# Patient Record
Sex: Female | Born: 1995 | Hispanic: No | Marital: Single | Smoking: Former smoker
Health system: Southern US, Community
[De-identification: ages and names within clinical notes are randomized; demographics above are authoritative.]

## PROBLEM LIST (undated history)

## (undated) DIAGNOSIS — Z8489 Family history of other specified conditions: Secondary | ICD-10-CM

## (undated) DIAGNOSIS — N390 Urinary tract infection, site not specified: Secondary | ICD-10-CM

## (undated) DIAGNOSIS — R42 Dizziness and giddiness: Secondary | ICD-10-CM

## (undated) DIAGNOSIS — B379 Candidiasis, unspecified: Secondary | ICD-10-CM

## (undated) DIAGNOSIS — U071 COVID-19: Secondary | ICD-10-CM

## (undated) HISTORY — DX: Family history of other specified conditions: Z84.89

## (undated) HISTORY — PX: OTHER SURGICAL HISTORY: SHX169

## (undated) HISTORY — PX: TONSILLECTOMY: SHX5217

## (undated) HISTORY — DX: COVID-19: U07.1

## (undated) HISTORY — PX: TONSILLECTOMY: SUR1361

---
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
## 2013-12-31 ENCOUNTER — Encounter (HOSPITAL_COMMUNITY): Payer: Self-pay | Admitting: Emergency Medicine

## 2013-12-31 ENCOUNTER — Emergency Department (HOSPITAL_COMMUNITY)
Admission: EM | Admit: 2013-12-31 | Discharge: 2013-12-31 | Disposition: A | Discharge status: Home / Self Care | Payer: 59 | Attending: Emergency Medicine | Admitting: Emergency Medicine

## 2013-12-31 DIAGNOSIS — Z87440 Personal history of urinary (tract) infections: Secondary | ICD-10-CM | POA: Insufficient documentation

## 2013-12-31 DIAGNOSIS — N898 Other specified noninflammatory disorders of vagina: Secondary | ICD-10-CM | POA: Insufficient documentation

## 2013-12-31 DIAGNOSIS — Z8619 Personal history of other infectious and parasitic diseases: Secondary | ICD-10-CM | POA: Insufficient documentation

## 2013-12-31 DIAGNOSIS — Z91040 Latex allergy status: Secondary | ICD-10-CM | POA: Insufficient documentation

## 2013-12-31 DIAGNOSIS — Z3202 Encounter for pregnancy test, result negative: Secondary | ICD-10-CM | POA: Insufficient documentation

## 2013-12-31 LAB — URINALYSIS, ROUTINE W REFLEX MICROSCOPIC
BILIRUBIN URINE: NEGATIVE
Glucose, UA: NEGATIVE mg/dL
KETONES UR: NEGATIVE mg/dL
Leukocytes, UA: NEGATIVE
NITRITE: NEGATIVE
PH: 6 (ref 5.0–8.0)
Protein, ur: NEGATIVE mg/dL
SPECIFIC GRAVITY, URINE: 1.027 (ref 1.005–1.030)
Urobilinogen, UA: 1 mg/dL (ref 0.0–1.0)

## 2013-12-31 LAB — URINE MICROSCOPIC-ADD ON

## 2013-12-31 LAB — WET PREP, GENITAL
Trich, Wet Prep: NONE SEEN
Yeast Wet Prep HPF POC: NONE SEEN

## 2013-12-31 LAB — PREGNANCY, URINE: PREG TEST UR: NEGATIVE

## 2013-12-31 LAB — HIV ANTIBODY (ROUTINE TESTING W REFLEX): HIV: NONREACTIVE

## 2013-12-31 NOTE — ED Provider Notes (Signed)
CSN: [id number]     Arrival date & time 12/31/13  0206 History   First MD Initiated Contact with Patient 12/31/13 0303     Chief Complaint  Patient presents with  . Vaginal Discharge     (Consider location/radiation/quality/duration/timing/severity/associated sxs/prior Treatment) HPI Comments: 18 y.o. female complains of clear and white vaginal discharge for 1-2 weeks. Denies abnormal vaginal bleeding or significant pelvic pain or fever. No UTI symptoms. Denies history of known exposure to STD in the last 2 years, but she does have herpes hx. Currently having intercourse with 1 partner and in a monogamous relationship.  Patient's last menstrual period was 12/06/2013. Pt was just given flagyl for BV, and pt's sx startedafter the antibiotic use.  Patient is a [age] y.o. female presenting with vaginal discharge. The history is provided by the patient.  Vaginal Discharge Associated symptoms: no abdominal pain, no dysuria, no fever, no nausea and no vomiting     Past Medical History  Diagnosis Date  . UTI (lower urinary tract infection)   . Yeast infection    Past Surgical History  Procedure Laterality Date  . Tonsillectomy    . Tonsillectomy    . Wisdonm teeth removed     No family history on file. History  Substance Use Topics  . Smoking status: Never Smoker   . Smokeless tobacco: Not on file  . Alcohol Use: No   OB History   Grav Para Term Preterm Abortions TAB SAB Ect Mult Living                 Review of Systems  Constitutional: Negative for fever.  Gastrointestinal: Negative for nausea, vomiting and abdominal pain.  Genitourinary: Positive for vaginal discharge. Negative for dysuria, flank pain, vaginal bleeding and vaginal pain.      Allergies  Fish allergy and Latex  Home Medications   Prior to Admission medications   Not on File   BP 114/71  Pulse 72  Temp(Src) 98.1 F (36.7 C) (Oral)  Resp 18  Wt 289 lb 7 oz (131.288 kg)  SpO2 100%  LMP  12/06/2013 Physical Exam  Nursing note and vitals reviewed. Constitutional: She is oriented to person, place, and time. She appears well-developed.  HENT:  Head: Normocephalic and atraumatic.  Eyes: Conjunctivae and EOM are normal. Pupils are equal, round, and reactive to light.  Neck: Normal range of motion. Neck supple.  Cardiovascular: Normal rate, regular rhythm, normal heart sounds and intact distal pulses.   No murmur heard. Pulmonary/Chest: Effort normal. No respiratory distress. She has no wheezes.  Abdominal: Soft. Bowel sounds are normal. She exhibits no distension. There is no tenderness. There is no rebound and no guarding.  Genitourinary: Vagina normal and uterus normal.  External exam - normal, no lesions Speculum exam: Pt has some white discharge, no blood Bimanual exam: Patient has no CMT, no adnexal tenderness or fullness and cervical os is closed  Neurological: She is alert and oriented to person, place, and time.  Skin: Skin is warm and dry.    ED Course  Procedures (including critical care time) Labs Review Labs Reviewed  WET PREP, GENITAL - Abnormal; Notable for the following:    Clue Cells Wet Prep HPF POC FEW (*)    WBC, Wet Prep HPF POC FEW (*)    All other components within normal limits  URINALYSIS, ROUTINE W REFLEX MICROSCOPIC - Abnormal; Notable for the following:    APPearance CLOUDY (*)    Hgb urine dipstick TRACE (*)  All other components within normal limits  URINE MICROSCOPIC-ADD ON - Abnormal; Notable for the following:    Squamous Epithelial / LPF FEW (*)    Bacteria, UA FEW (*)    All other components within normal limits  GC/CHLAMYDIA PROBE AMP  PREGNANCY, URINE  HIV ANTIBODY (ROUTINE TESTING)    Imaging Review No results found.   EKG Interpretation None      MDM   Final diagnoses:  Vaginal discharge    Pt here for Vaginal discharge. Pelvic exam is normal. The lab results show clue cell only. Advised Gyne f/u if  needed.   Derwood KaplanAnkit Electa Sterry, MD 12/31/13 [phone]

## 2013-12-31 NOTE — ED Notes (Signed)
Pt reports she was treated a couple weeks ago for bacterial vaginitis, completed her medications. Since then, she has been experiencing vaginal discharge, sometimes white and sometimes clear.  Denies odor, pain, itching.

## 2013-12-31 NOTE — ED Notes (Signed)
The pt has had a vaginal discharge for 1-2 weeks.  She was treated for a pelvic infection 2 weeks ago and was on antibiotics and she thinks the drainage may be from the antibiotics.  The discharge has no odor.  lmp June 10

## 2013-12-31 NOTE — Discharge Instructions (Signed)
We saw you in the ER for the vaginal discharge. All the results in the ER are normal. We are not sure what is causing your symptoms. The workup in the ER is not complete, and is limited to screening for life threatening and emergent conditions only, so please see a primary care doctor for further evaluation.

## 2014-01-01 LAB — GC/CHLAMYDIA PROBE AMP
CT Probe RNA: NEGATIVE
GC PROBE AMP APTIMA: NEGATIVE

## 2015-04-04 ENCOUNTER — Encounter (HOSPITAL_COMMUNITY): Payer: Self-pay | Admitting: Emergency Medicine

## 2015-04-04 ENCOUNTER — Emergency Department (HOSPITAL_COMMUNITY)
Admission: EM | Admit: 2015-04-04 | Discharge: 2015-04-04 | Disposition: A | Discharge status: Home / Self Care | Payer: Medicaid Other | Attending: Emergency Medicine | Admitting: Emergency Medicine

## 2015-04-04 DIAGNOSIS — Z91040 Latex allergy status: Secondary | ICD-10-CM | POA: Insufficient documentation

## 2015-04-04 DIAGNOSIS — H6091 Unspecified otitis externa, right ear: Secondary | ICD-10-CM | POA: Diagnosis not present

## 2015-04-04 DIAGNOSIS — Z87440 Personal history of urinary (tract) infections: Secondary | ICD-10-CM | POA: Diagnosis not present

## 2015-04-04 DIAGNOSIS — Z8619 Personal history of other infectious and parasitic diseases: Secondary | ICD-10-CM | POA: Diagnosis not present

## 2015-04-04 DIAGNOSIS — H9201 Otalgia, right ear: Secondary | ICD-10-CM | POA: Diagnosis present

## 2015-04-04 MED ORDER — NEOMYCIN-POLYMYXIN-HC 3.5-10000-1 OT SUSP: 3.0000 [drp] | Freq: Three times a day (TID) | OTIC | Status: DC

## 2015-04-04 NOTE — Discharge Instructions (Signed)
Apply eardrops 2-3 times a day. Apply a cotton wick. Would also recommend over-the-counter anti-histamine such as Claritin

## 2015-04-04 NOTE — ED Notes (Signed)
Pt sts right sided ear pain that feels like she got water in ear and wont come out

## 2015-04-04 NOTE — ED Provider Notes (Signed)
CSN: [id number]     Arrival date & time 04/04/15  1230 History  By signing my name below, I, Jarvis Morgan, attest that this documentation has been prepared under the direction and in the presence of Donnetta Hutching, MD. Electronically Signed: Jarvis Morgan, ED Scribe. 04/04/2015. 2:24 PM.    Chief Complaint  Patient presents with  . Otalgia    The history is provided by the patient. No language interpreter was used.    HPI Comments: Nichole Carlson is a 19 y.o. female who presents to the Emergency Department complaining of sudden onset, constant, mild, right sided otalgia onset 2 days. She states the pain began after she got out of the shower. She reports that she has a sensation of water stuck in her right ear. Pt has not taken anything for her symptoms. She denies any aggravating factors. Pt denies any fever or URI symptoms.   Past Medical History  Diagnosis Date  . UTI (lower urinary tract infection)   . Yeast infection    Past Surgical History  Procedure Laterality Date  . Tonsillectomy    . Tonsillectomy    . Wisdonm teeth removed     History reviewed. No pertinent family history. Social History  Substance Use Topics  . Smoking status: Never Smoker   . Smokeless tobacco: None  . Alcohol Use: No   OB History    No data available     Review of Systems 10 Systems reviewed and all are negative for acute change except as noted in the HPI.     Allergies  Fish allergy and Latex  Home Medications   Prior to Admission medications   Medication Sig Start Date End Date Taking? Authorizing Provider  neomycin-polymyxin-hydrocortisone (CORTISPORIN) 3.5-10000-1 otic suspension Place 3 drops into the right ear 3 (three) times daily. 04/04/15   Donnetta Hutching, MD   Triage Vitals: BP 154/98 mmHg  Pulse 90  Temp(Src) 98.1 F (36.7 C) (Oral)  Resp 20  Ht  (1.727 m)  Wt 330 lb (149.687 kg)  BMI 50.19 kg/m2  SpO2 99%  Physical Exam  Constitutional: She is oriented to person,  place, and time. She appears well-developed and well-nourished.  HENT:  Head: Normocephalic and atraumatic.  Right Ear: Tympanic membrane normal.  Left Ear: Tympanic membrane normal.  Mouth/Throat: Oropharynx is clear and moist.  Eyes: Conjunctivae and EOM are normal. Pupils are equal, round, and reactive to light.  Neck: Normal range of motion. Neck supple.  Musculoskeletal: Normal range of motion.  Neurological: She is alert and oriented to person, place, and time.  Skin: Skin is warm and dry.  Psychiatric: She has a normal mood and affect. Her behavior is normal.  Nursing note and vitals reviewed.   ED Course  Procedures (including critical care time)  DIAGNOSTIC STUDIES: Oxygen Saturation is 99% on RA, normal by my interpretation.    COORDINATION OF CARE: 2:09 PM- Will prescribe cortisporin otic drops. Pt advised of plan for treatment and pt agrees.     Labs Review Labs Reviewed - No data to display  Imaging Review No results found. I have personally reviewed and evaluated these images and lab results as part of my medical decision-making.   EKG Interpretation None      MDM   Final diagnoses:  Right otitis externa   Tympanic membrane was clear. No obvious inflammation in the external canal.  Rx Cortisporin Otic.  I, Kinze Labo, personally performed the services described in this documentation. All medical record entries  made by the scribe were at my direction and in my presence.  I have reviewed the chart and discharge instructions and agree that the record reflects my personal performance and is accurate and complete. Norabelle Kondo.  04/04/2015. 2:24 PM.      Donnetta Hutching, MD 04/04/15 1426

## 2015-04-04 NOTE — ED Notes (Signed)
Declined W/C at D/C and was escorted to lobby by RN. 

## 2015-05-02 ENCOUNTER — Encounter (HOSPITAL_COMMUNITY): Payer: Self-pay | Admitting: Emergency Medicine

## 2015-05-02 ENCOUNTER — Emergency Department (HOSPITAL_COMMUNITY)
Admission: EM | Admit: 2015-05-02 | Discharge: 2015-05-02 | Disposition: A | Discharge status: Home / Self Care | Payer: Medicaid Other | Attending: Emergency Medicine | Admitting: Emergency Medicine

## 2015-05-02 DIAGNOSIS — M545 Low back pain: Secondary | ICD-10-CM | POA: Insufficient documentation

## 2015-05-02 DIAGNOSIS — B9689 Other specified bacterial agents as the cause of diseases classified elsewhere: Secondary | ICD-10-CM

## 2015-05-02 DIAGNOSIS — H6091 Unspecified otitis externa, right ear: Secondary | ICD-10-CM | POA: Insufficient documentation

## 2015-05-02 DIAGNOSIS — N760 Acute vaginitis: Secondary | ICD-10-CM | POA: Insufficient documentation

## 2015-05-02 DIAGNOSIS — Z3202 Encounter for pregnancy test, result negative: Secondary | ICD-10-CM | POA: Insufficient documentation

## 2015-05-02 DIAGNOSIS — A5901 Trichomonal vulvovaginitis: Secondary | ICD-10-CM | POA: Insufficient documentation

## 2015-05-02 DIAGNOSIS — A599 Trichomoniasis, unspecified: Secondary | ICD-10-CM

## 2015-05-02 DIAGNOSIS — R1030 Lower abdominal pain, unspecified: Secondary | ICD-10-CM | POA: Diagnosis present

## 2015-05-02 LAB — URINALYSIS, ROUTINE W REFLEX MICROSCOPIC
Bilirubin Urine: NEGATIVE
Glucose, UA: NEGATIVE mg/dL
Ketones, ur: NEGATIVE mg/dL
LEUKOCYTES UA: NEGATIVE
NITRITE: NEGATIVE
Protein, ur: NEGATIVE mg/dL
SPECIFIC GRAVITY, URINE: 1.023 (ref 1.005–1.030)
Urobilinogen, UA: 1 mg/dL (ref 0.0–1.0)
pH: 7.5 (ref 5.0–8.0)

## 2015-05-02 LAB — CBC
HCT: 40 % (ref 36.0–46.0)
Hemoglobin: 12.5 g/dL (ref 12.0–15.0)
MCH: 26.3 pg (ref 26.0–34.0)
MCHC: 31.3 g/dL (ref 30.0–36.0)
MCV: 84 fL (ref 78.0–100.0)
Platelets: 240 10*3/uL (ref 150–400)
RBC: 4.76 MIL/uL (ref 3.87–5.11)
RDW: 14.7 % (ref 11.5–15.5)
WBC: 10.6 10*3/uL — AB (ref 4.0–10.5)

## 2015-05-02 LAB — URINE MICROSCOPIC-ADD ON

## 2015-05-02 LAB — WET PREP, GENITAL: YEAST WET PREP: NONE SEEN

## 2015-05-02 LAB — POC URINE PREG, ED: Preg Test, Ur: NEGATIVE

## 2015-05-02 MED ORDER — METRONIDAZOLE 500 MG PO TABS: 500.0000 mg | ORAL_TABLET | Freq: Two times a day (BID) | ORAL | Status: DC

## 2015-05-02 NOTE — Discharge Instructions (Signed)
Blood count was normal.  You have two vaginal infections called bacterial vaginosis and trichimoniasis.  Prescription for antibiotic.  Follow-up with your GYN

## 2015-05-02 NOTE — ED Notes (Signed)
Pt reports lower back pain and lower abdominal pain that has been happening since 04/09/15. Pt states her period has been off and on since then and irregular. Pt also state frequent urination.

## 2015-05-03 LAB — GC/CHLAMYDIA PROBE AMP (~~LOC~~) NOT AT ARMC
Chlamydia: POSITIVE — AB
Neisseria Gonorrhea: NEGATIVE

## 2015-05-03 NOTE — ED Provider Notes (Deleted)
Right otitis externa.  Donnetta HutchingBrian Adley Castello, MD 05/03/15 1719

## 2015-05-06 ENCOUNTER — Telehealth (HOSPITAL_COMMUNITY): Payer: Self-pay

## 2015-05-06 NOTE — Telephone Encounter (Signed)
Positive for chlamydia. Chart sent to EDP office for review.  

## 2015-05-09 ENCOUNTER — Telehealth (HOSPITAL_COMMUNITY): Payer: Self-pay

## 2015-05-09 NOTE — Telephone Encounter (Signed)
Chart returned from EDP office. Pt needs rocephin 250mg  im , doxycycline 100mg  bid x 14 days and flagyl 500 mg bid x 14 days.  Attempting to contact pt.

## 2015-05-11 ENCOUNTER — Telehealth (HOSPITAL_BASED_OUTPATIENT_CLINIC_OR_DEPARTMENT_OTHER): Payer: Self-pay | Admitting: Emergency Medicine

## 2015-05-11 NOTE — Telephone Encounter (Signed)
Post ED Visit - Positive Culture Follow-up: Successful Patient Follow-Up  Culture assessed and recommendations reviewed by: []  Enzo BiNathan Batchelder, Pharm.D. []  Celedonio MiyamotoJeremy Frens, Pharm.D., BCPS []  Garvin FilaMike Maccia, Pharm.D. []  Georgina PillionElizabeth Martin, Pharm.D., BCPS []  PhiloMinh Pham, [place]arm[place]D., BCPS, AAHIVP []  Estella HuskMichelle Turner, Pharm.D., BCPS, AAHIVP []  Tennis Mustassie Stewart, Pharm.D. []  Rob Oswaldo DoneVincent, [address]Pharm[address]  Positive chlamydia culture  [x]  Patient discharged without antimicrobial prescription and treatment is now indicated []  Organism is resistant to prescribed ED discharge antimicrobial []  Patient with positive blood cultures  Changes discussed with ED provider: [place] New antibiotic prescription Doxycycline 100mg  po bid x 14 days, Flagyl 500mg  po bid x 14 days, needs to return ED, [facility], or health dept for Rocephin 250mg  [facility]  Contacted patient, date 05/11/2015 1047   [name], Nichole Thetford 05/11/2015, 5:06 PM

## 2015-05-13 ENCOUNTER — Emergency Department (HOSPITAL_COMMUNITY)
Admission: EM | Admit: 2015-05-13 | Discharge: 2015-05-13 | Disposition: A | Discharge status: Home / Self Care | Payer: Medicaid Other | Attending: Emergency Medicine | Admitting: Emergency Medicine

## 2015-05-13 ENCOUNTER — Encounter (HOSPITAL_COMMUNITY): Payer: Self-pay | Admitting: *Deleted

## 2015-05-13 DIAGNOSIS — Z8619 Personal history of other infectious and parasitic diseases: Secondary | ICD-10-CM | POA: Insufficient documentation

## 2015-05-13 DIAGNOSIS — Z792 Long term (current) use of antibiotics: Secondary | ICD-10-CM | POA: Diagnosis not present

## 2015-05-13 DIAGNOSIS — Z91040 Latex allergy status: Secondary | ICD-10-CM | POA: Diagnosis not present

## 2015-05-13 DIAGNOSIS — R1030 Lower abdominal pain, unspecified: Secondary | ICD-10-CM | POA: Diagnosis not present

## 2015-05-13 DIAGNOSIS — Z202 Contact with and (suspected) exposure to infections with a predominantly sexual mode of transmission: Secondary | ICD-10-CM

## 2015-05-13 DIAGNOSIS — Z09 Encounter for follow-up examination after completed treatment for conditions other than malignant neoplasm: Secondary | ICD-10-CM | POA: Diagnosis present

## 2015-05-13 DIAGNOSIS — Z87440 Personal history of urinary (tract) infections: Secondary | ICD-10-CM | POA: Insufficient documentation

## 2015-05-13 MED ORDER — AZITHROMYCIN 1 G PO PACK
1.0000 g | PACK | Freq: Once | ORAL | Status: AC
  Administered 2015-05-13: 1 g via ORAL
  Filled 2015-05-13: qty 1

## 2015-05-13 MED ORDER — CEFTRIAXONE SODIUM 250 MG IJ SOLR
250.0000 mg | Freq: Once | INTRAMUSCULAR | Status: AC
  Administered 2015-05-13: 250 mg via INTRAMUSCULAR
  Filled 2015-05-13: qty 250

## 2015-05-13 NOTE — ED Provider Notes (Signed)
CSN: [id number]     Arrival date & time 05/13/15  1629 History  By signing my name below, I, Murriel HopperAlec Bankhead, attest that this documentation has been prepared under the direction and in the presence of Felicie Mornavid Ladislao Cohenour, NP.  Electronically Signed: Murriel HopperAlec Bankhead, ED Scribe. 05/13/2015. 5:32 PM.   Chief Complaint  Patient presents with  . Follow-up      The history is provided by the patient. No language interpreter was used.   HPI Comments: Nichole Carlson is a 19 y.o. female who presents to the Emergency Department for a follow-up from her visit to ED on 05/02/15. Pt was seen on 11/3 for symptoms of an STD, and was diagnosed with Trichomonas. Her culture came back positive for Chlamydia recently as she was called back to ED for treatment. Pt states she finished her treatment for Trichomonas four days ago. Pt also reports currently having mild lower abdominal pain, but denies any other symptoms.    Past Medical History  Diagnosis Date  . UTI (lower urinary tract infection)   . Yeast infection    Past Surgical History  Procedure Laterality Date  . Tonsillectomy    . Tonsillectomy    . Wisdonm teeth removed     History reviewed. No pertinent family history. Social History  Substance Use Topics  . Smoking status: Never Smoker   . Smokeless tobacco: None  . Alcohol Use: No   OB History    No data available     Review of Systems  Gastrointestinal: Positive for abdominal pain.  All other systems reviewed and are negative.     Allergies  Fish allergy and Latex  Home Medications   Prior to Admission medications   Medication Sig Start Date End Date Taking? Authorizing Provider  calcium carbonate (TUMS - DOSED IN MG ELEMENTAL CALCIUM) 500 MG chewable tablet Chew 1 tablet by mouth daily as needed for indigestion or heartburn.    Historical Provider, MD  diphenhydrAMINE (BENADRYL) 25 MG tablet Take 25 mg by mouth every 6 (six) hours as needed for itching.    Historical Provider, MD   metroNIDAZOLE (FLAGYL) 500 MG tablet Take 1 tablet (500 mg total) by mouth 2 (two) times daily. 05/02/15   Donnetta HutchingBrian Cook, MD  neomycin-polymyxin-hydrocortisone (CORTISPORIN) 3.5-10000-1 otic suspension Place 3 drops into the right ear 3 (three) times daily. Patient not taking: Reported on 05/02/2015 04/04/15   Donnetta HutchingBrian Cook, MD   BP 123/56 mmHg  Pulse 69  Temp(Src) 98.2 F (36.8 C) (Oral)  Resp 18  Ht 5\' 8"  (1.727 m)  Wt 327 lb (148.326 kg)  BMI 49.73 kg/m2  SpO2 97%  LMP 04/16/2015 Physical Exam  Constitutional: She is oriented to person, place, and time. She appears well-developed and well-nourished.  HENT:  Head: Normocephalic and atraumatic.  Cardiovascular: Normal rate.   Pulmonary/Chest: Effort normal.  Abdominal: She exhibits no distension.  Neurological: She is alert and oriented to person, place, and time.  Skin: Skin is warm and dry.  Psychiatric: She has a normal mood and affect.  Nursing note and vitals reviewed.   ED Course  Procedures (including critical care time)  DIAGNOSTIC STUDIES: Oxygen Saturation is 97% on room air, normal by my interpretation.    COORDINATION OF CARE: 5:31 PM Discussed treatment plan with pt at bedside and pt agreed to plan.   Labs Review Labs Reviewed - No data to display  Imaging Review No results found. I have personally reviewed and evaluated these images and lab results as  part of my medical decision-making.   EKG Interpretation None     Patient seen in ED on 05/02/15 with STD cultures obtained.  She was treated for trichomonas at her initial visit with flagyl, and patient has completed that course of medication.  STD cultures reviewed and patient was advised to return for rocephin and chlamydia treatment.  Patient reports mild low abdominal/pelvic pain without discharge or dysuria.  Patient received 250 mg rocephin IM and 1000 mg azithromycin.  Her partner is aware of diagnosis.  MDM   Final diagnoses:  None     STD. Follow-up with STD clinic at health department. Return precautions discussed.   I personally performed the services described in this documentation, which was scribed in my presence. The recorded information has been reviewed and is accurate.    Felicie Morn, NP 05/14/15 1610  Mancel Bale, MD 05/16/15 [phone]

## 2015-05-13 NOTE — ED Notes (Signed)
Pt seen here on 11/03 and was called to come back for treatment of clyhmadia, denis any new complaints.

## 2015-05-13 NOTE — Discharge Instructions (Signed)
Chlamydia, Female Chlamydia is an infection. It is spread through sexual contact. Chlamydia can be in different areas of the body. These areas include the cervix, urethra, throat, or rectum. You may not know you have chlamydia because many people never develop the symptoms. Chlamydia is not difficult to treat once you know you have it. However, if it is left untreated, chlamydia can lead to more serious health problems.  CAUSES  Chlamydia is caused by bacteria. It is a sexually transmitted disease. It is passed from an infected partner during intimate contact. This contact could be with the genitals, mouth, or rectal area. Chlamydia can also be passed from mothers to babies during birth. SIGNS AND SYMPTOMS  There may not be any symptoms. This is often the case early in the infection. If symptoms develop, they may include:  Mild pain and discomfort when urinating.  Redness, soreness, and swelling (inflammation) of the rectum.  Vaginal discharge.  Painful intercourse.  Abdominal pain.  Bleeding between menstrual periods. DIAGNOSIS  To diagnose this infection, your health care provider will do a pelvic exam. Cultures will be taken of the vagina, cervix, urine, and possibly the rectum to verify the diagnosis.  TREATMENT You will be given antibiotic medicines. If you are pregnant, certain types of antibiotics will need to be avoided. Any sexual partners should also be treated, even if they do not show symptoms. Your health care provider may test you for infection again 3 months after treatment. HOME CARE INSTRUCTIONS   Take your antibiotic medicine as directed by your health care provider. Finish the antibiotic even if you start to feel better.  Take medicines only as directed by your health care provider.  Inform any sexual partners about the infection. They should also be treated.  Do not have sexual contact until your health care provider tells you it is okay.  Get plenty of  rest.  Eat a well-balanced diet.  Drink enough fluids to keep your urine clear or pale yellow.  Keep all follow-up visits as directed by your health care provider. SEEK MEDICAL CARE IF:  You have painful urination.  You have abdominal pain.  You have vaginal discharge.  You have painful sexual intercourse.  You have bleeding between periods and after sex.  You have a fever. SEEK IMMEDIATE MEDICAL CARE IF:   You experience nausea or vomiting.  You experience excessive sweating (diaphoresis).  You have difficulty swallowing.   This information is not intended to replace advice given to you by your health care provider. Make sure you discuss any questions you have with your health care provider.   Document Released: 03/25/2005 Document Revised: 03/06/2015 Document Reviewed: 02/20/2013 Elsevier Interactive Patient Education 2016 Elsevier Inc.  

## 2015-05-18 ENCOUNTER — Encounter (HOSPITAL_COMMUNITY): Payer: Self-pay | Admitting: Nurse Practitioner

## 2015-05-18 ENCOUNTER — Emergency Department (HOSPITAL_COMMUNITY)
Admission: EM | Admit: 2015-05-18 | Discharge: 2015-05-18 | Disposition: A | Discharge status: Home / Self Care | Payer: Medicaid Other | Attending: Emergency Medicine | Admitting: Emergency Medicine

## 2015-05-18 DIAGNOSIS — H6691 Otitis media, unspecified, right ear: Secondary | ICD-10-CM | POA: Insufficient documentation

## 2015-05-18 DIAGNOSIS — J3489 Other specified disorders of nose and nasal sinuses: Secondary | ICD-10-CM | POA: Diagnosis not present

## 2015-05-18 DIAGNOSIS — Z91040 Latex allergy status: Secondary | ICD-10-CM | POA: Insufficient documentation

## 2015-05-18 DIAGNOSIS — F17210 Nicotine dependence, cigarettes, uncomplicated: Secondary | ICD-10-CM | POA: Insufficient documentation

## 2015-05-18 DIAGNOSIS — J029 Acute pharyngitis, unspecified: Secondary | ICD-10-CM | POA: Diagnosis not present

## 2015-05-18 DIAGNOSIS — H9201 Otalgia, right ear: Secondary | ICD-10-CM | POA: Diagnosis present

## 2015-05-18 DIAGNOSIS — Z87440 Personal history of urinary (tract) infections: Secondary | ICD-10-CM | POA: Diagnosis not present

## 2015-05-18 DIAGNOSIS — Z79899 Other long term (current) drug therapy: Secondary | ICD-10-CM | POA: Diagnosis not present

## 2015-05-18 DIAGNOSIS — Z8619 Personal history of other infectious and parasitic diseases: Secondary | ICD-10-CM | POA: Diagnosis not present

## 2015-05-18 MED ORDER — AMOXICILLIN 500 MG PO CAPS: 500.0000 mg | ORAL_CAPSULE | Freq: Three times a day (TID) | ORAL | Status: DC

## 2015-05-18 NOTE — Discharge Instructions (Signed)

## 2015-05-18 NOTE — ED Provider Notes (Signed)
CSN: [id number]     Arrival date & time 05/18/15  1525 History   First MD Initiated Contact with Patient 05/18/15 1538     Chief Complaint  Patient presents with  . URI     (Consider location/radiation/quality/duration/timing/severity/associated sxs/prior Treatment) HPI Comments: Patient presents to the emergency department with chief complaint of earache. She reports associated cold symptoms, such as sore throat, rhinorrhea, congestion, itchy and watery eyes. She has a history of ear infections. She has not tried taking anything to alleviate her symptoms. There are no aggravating or alleviating factors. She denies any associated fevers or chills. She denies any other past medical problems.  The history is provided by the patient. No language interpreter was used.    Past Medical History  Diagnosis Date  . UTI (lower urinary tract infection)   . Yeast infection    Past Surgical History  Procedure Laterality Date  . Tonsillectomy    . Tonsillectomy    . Wisdonm teeth removed     History reviewed. No pertinent family history. Social History  Substance Use Topics  . Smoking status: Current Every Day Smoker    Types: Cigarettes  . Smokeless tobacco: None  . Alcohol Use: No   OB History    No data available     Review of Systems  Constitutional: Negative for fever and chills.  HENT: Positive for ear pain, postnasal drip, rhinorrhea, sinus pressure, sneezing and sore throat.   Respiratory: Positive for cough. Negative for shortness of breath.   Cardiovascular: Negative for chest pain.  Gastrointestinal: Negative for nausea, vomiting, abdominal pain, diarrhea and constipation.  Genitourinary: Negative for dysuria.      Allergies  Fish allergy and Latex  Home Medications   Prior to Admission medications   Medication Sig Start Date End Date Taking? Authorizing Provider  calcium carbonate (TUMS - DOSED IN MG ELEMENTAL CALCIUM) 500 MG chewable tablet Chew 1 tablet by  mouth daily as needed for indigestion or heartburn.    Historical Provider, MD  diphenhydrAMINE (BENADRYL) 25 MG tablet Take 25 mg by mouth every 6 (six) hours as needed for itching.    Historical Provider, MD  metroNIDAZOLE (FLAGYL) 500 MG tablet Take 1 tablet (500 mg total) by mouth 2 (two) times daily. 05/02/15   Donnetta Hutching, MD  neomycin-polymyxin-hydrocortisone (CORTISPORIN) 3.5-10000-1 otic suspension Place 3 drops into the right ear 3 (three) times daily. Patient not taking: Reported on 05/02/2015 04/04/15   Donnetta Hutching, MD   BP 143/76 mmHg  Pulse 83  Temp(Src) 98.7 F (37.1 C) (Oral)  Resp 18  SpO2 95%  LMP 04/16/2015 Physical Exam  Constitutional: She appears well-developed and well-nourished. No distress.  HENT:  Head: Normocephalic.  Right Ear: External ear normal.  Left Ear: External ear normal.  Mildly erythematous, no tonsillar exudate, no abscess, no stridor, uvula is midline  Right tympanic membrane is erythematous with congestion behind it  Eyes: Conjunctivae and EOM are normal. Pupils are equal, round, and reactive to light.  Neck: Normal range of motion. Neck supple.  Cardiovascular: Normal rate, regular rhythm and normal heart sounds.  Exam reveals no gallop and no friction rub.   No murmur heard. Pulmonary/Chest: Effort normal and breath sounds normal. No stridor. No respiratory distress. She has no wheezes. She has no rales. She exhibits no tenderness.  CTAB  Abdominal: Soft. Bowel sounds are normal. She exhibits no distension. There is no tenderness.  Musculoskeletal: Normal range of motion. She exhibits no tenderness.  Neurological: She  is alert.  Skin: Skin is warm and dry. No rash noted. She is not diaphoretic.  Psychiatric: She has a normal mood and affect. Her behavior is normal. Judgment and thought content normal.  Nursing note and vitals reviewed.   ED Course  Procedures (including critical care time)   MDM   Final diagnoses:  Acute right otitis  media, recurrence not specified, unspecified otitis media type    Treat for otitis media. Recommend primary care follow-up.    Roxy Horsemanobert Markis Langland, PA-C 05/18/15 1605  Nelva Nayobert Beaton, MD 05/19/15 [phone]

## 2015-05-18 NOTE — ED Notes (Signed)
She c/o 6 day history of congestion, runny, itchy/watery eyes, then yhesterday began to have R ear ache. Concerned for ear infection, shes had them in the past. She had chills on wednesday night. A&Ox4, resp e/u

## 2015-07-12 NOTE — ED Provider Notes (Signed)
CSN: [id number]     Arrival date & time 05/02/15  1728 History   First MD Initiated Contact with Patient 05/02/15 2120     Chief Complaint  Patient presents with  . Abdominal Pain     (Consider location/radiation/quality/duration/timing/severity/associated sxs/prior Treatment) HPI... Lower abdominal pain and lower back pain for several weeks. Irregular menstrual cycle. No fever, sweats, chills, chest pain, dyspnea, vaginal bleeding, vaginal discharge, flank pain.  Questionable frequent urination. Severity is mild. Nothing makes symptoms better or worse  Past Medical History  Diagnosis Date  . UTI (lower urinary tract infection)   . Yeast infection    Past Surgical History  Procedure Laterality Date  . Tonsillectomy    . Tonsillectomy    . Wisdonm teeth removed     No family history on file. Social History  Substance Use Topics  . Smoking status: Current Every Day Smoker    Types: Cigarettes  . Smokeless tobacco: None  . Alcohol Use: No   OB History    No data available     Review of Systems  All other systems reviewed and are negative.     Allergies  Fish allergy and Latex  Home Medications   Prior to Admission medications   Medication Sig Start Date End Date Taking? Authorizing Provider  calcium carbonate (TUMS - DOSED IN MG ELEMENTAL CALCIUM) 500 MG chewable tablet Chew 1 tablet by mouth daily as needed for indigestion or heartburn.   Yes Historical Provider, MD  diphenhydrAMINE (BENADRYL) 25 MG tablet Take 25 mg by mouth every 6 (six) hours as needed for itching.   Yes Historical Provider, MD  amoxicillin (AMOXIL) 500 MG capsule Take 1 capsule (500 mg total) by mouth 3 (three) times daily. 05/18/15   Roxy Horseman, PA-C  metroNIDAZOLE (FLAGYL) 500 MG tablet Take 1 tablet (500 mg total) by mouth 2 (two) times daily. 05/02/15   Donnetta Hutching, MD  neomycin-polymyxin-hydrocortisone (CORTISPORIN) 3.5-10000-1 otic suspension Place 3 drops into the right ear 3 (three)  times daily. Patient not taking: Reported on 05/02/2015 04/04/15   Donnetta Hutching, MD   BP 119/53 mmHg  Pulse 76  Temp(Src) 98.5 F (36.9 C) (Oral)  Resp 16  Ht 5\' 9"  (1.753 m)  Wt 330 lb (149.687 kg)  BMI 48.71 kg/m2  SpO2 98%  LMP 04/16/2015 Physical Exam  Constitutional: She is oriented to person, place, and time.  Morbidly obese  HENT:  Head: Normocephalic and atraumatic.  Eyes: Conjunctivae and EOM are normal. Pupils are equal, round, and reactive to light.  Neck: Normal range of motion. Neck supple.  Cardiovascular: Normal rate and regular rhythm.   Pulmonary/Chest: Effort normal and breath sounds normal.  Abdominal: Soft. Bowel sounds are normal.  Nontender  Genitourinary:  Normal external genitalia.  Minimal cervical discharge. No cervical motion tenderness  Musculoskeletal: Normal range of motion.  Neurological: She is alert and oriented to person, place, and time.  Skin: Skin is warm and dry.  Psychiatric: She has a normal mood and affect. Her behavior is normal.  Nursing note and vitals reviewed.   ED Course  Procedures (including critical care time) Labs Review Labs Reviewed  WET PREP, GENITAL - Abnormal; Notable for the following:    Trich, Wet Prep FEW (*)    Clue Cells Wet Prep HPF POC MODERATE (*)    WBC, Wet Prep HPF POC FEW (*)    All other components within normal limits  CBC - Abnormal; Notable for the following:    WBC  10.6 (*)    All other components within normal limits  URINALYSIS, ROUTINE W REFLEX MICROSCOPIC (NOT AT [facility]) - Abnormal; Notable for the following:    APPearance TURBID (*)    Hgb urine dipstick TRACE (*)    All other components within normal limits  GC/CHLAMYDIA PROBE AMP () NOT AT [facility] - Abnormal; Notable for the following:    Chlamydia **POSITIVE** (*)    All other components within normal limits  URINE MICROSCOPIC-ADD ON  POC URINE PREG, ED    Imaging Review No results found. I have personally reviewed and  evaluated these images and lab results as part of my medical decision-making.   EKG Interpretation None      MDM   Final diagnoses:  Bacterial vaginosis  Trichimoniasis    Wet prep positive for Trichomonas and clue cells. We'll treat for trichomoniasis and bacterial vaginosis. Gonorrhea and chlamydia cultures pending. No acute abdomen.    Donnetta HutchingBrian Jimya Ciani, MD 07/12/15 [phone]

## 2015-11-02 ENCOUNTER — Emergency Department (HOSPITAL_COMMUNITY)
Admission: EM | Admit: 2015-11-02 | Discharge: 2015-11-03 | Disposition: A | Discharge status: Home / Self Care | Payer: Medicaid Other | Attending: Emergency Medicine | Admitting: Emergency Medicine

## 2015-11-02 ENCOUNTER — Encounter (HOSPITAL_COMMUNITY): Payer: Self-pay | Admitting: Emergency Medicine

## 2015-11-02 DIAGNOSIS — R300 Dysuria: Secondary | ICD-10-CM | POA: Diagnosis present

## 2015-11-02 DIAGNOSIS — Z3202 Encounter for pregnancy test, result negative: Secondary | ICD-10-CM | POA: Insufficient documentation

## 2015-11-02 DIAGNOSIS — F17210 Nicotine dependence, cigarettes, uncomplicated: Secondary | ICD-10-CM | POA: Diagnosis not present

## 2015-11-02 DIAGNOSIS — N760 Acute vaginitis: Secondary | ICD-10-CM | POA: Diagnosis not present

## 2015-11-02 DIAGNOSIS — Z8619 Personal history of other infectious and parasitic diseases: Secondary | ICD-10-CM | POA: Insufficient documentation

## 2015-11-02 DIAGNOSIS — Z91040 Latex allergy status: Secondary | ICD-10-CM | POA: Insufficient documentation

## 2015-11-02 DIAGNOSIS — B9689 Other specified bacterial agents as the cause of diseases classified elsewhere: Secondary | ICD-10-CM

## 2015-11-02 DIAGNOSIS — Z87440 Personal history of urinary (tract) infections: Secondary | ICD-10-CM | POA: Diagnosis not present

## 2015-11-02 LAB — URINALYSIS, ROUTINE W REFLEX MICROSCOPIC
BILIRUBIN URINE: NEGATIVE
Glucose, UA: NEGATIVE mg/dL
Hgb urine dipstick: NEGATIVE
KETONES UR: NEGATIVE mg/dL
Leukocytes, UA: NEGATIVE
NITRITE: NEGATIVE
Protein, ur: NEGATIVE mg/dL
SPECIFIC GRAVITY, URINE: 1.028 (ref 1.005–1.030)
pH: 7 (ref 5.0–8.0)

## 2015-11-02 LAB — CBC
HCT: 36.8 % (ref 36.0–46.0)
HEMOGLOBIN: 11.4 g/dL — AB (ref 12.0–15.0)
MCH: 25.6 pg — ABNORMAL LOW (ref 26.0–34.0)
MCHC: 31 g/dL (ref 30.0–36.0)
MCV: 82.7 fL (ref 78.0–100.0)
Platelets: 217 10*3/uL (ref 150–400)
RBC: 4.45 MIL/uL (ref 3.87–5.11)
RDW: 16.2 % — AB (ref 11.5–15.5)
WBC: 10.7 10*3/uL — ABNORMAL HIGH (ref 4.0–10.5)

## 2015-11-02 LAB — BASIC METABOLIC PANEL
Anion gap: 9 (ref 5–15)
BUN: 9 mg/dL (ref 6–20)
CO2: 24 mmol/L (ref 22–32)
Calcium: 9.2 mg/dL (ref 8.9–10.3)
Chloride: 108 mmol/L (ref 101–111)
Creatinine, Ser: 0.85 mg/dL (ref 0.44–1.00)
GFR calc Af Amer: >60 mL/min (ref >60)
GFR calc non Af Amer: >60 mL/min (ref >60)
GLUCOSE: 92 mg/dL (ref 65–99)
Potassium: 4 mmol/L (ref 3.5–5.1)
Sodium: 141 mmol/L (ref 135–145)

## 2015-11-02 LAB — POC URINE PREG, ED: PREG TEST UR: NEGATIVE

## 2015-11-02 NOTE — ED Notes (Signed)
Patient with uti symptoms.  Patient states that the symptoms started about a week ago.  She has abdominal pain and pain with urination.

## 2015-11-02 NOTE — ED Provider Notes (Signed)
CSN: [id number]     Arrival date & time 11/02/15  2152 History   First MD Initiated Contact with Patient 11/02/15 2229     Chief Complaint  Patient presents with  . Dysuria     (Consider location/radiation/quality/duration/timing/severity/associated sxs/prior Treatment) The history is provided by the patient and medical records. No language interpreter was used.     Nichole Carlson is a 20 y.o. female  with a hx of UTI presents to the Emergency Department complaining of gradual, persistent, progressively worsening dysuria described as "discomfort" onset 2 days ago. Associated symptoms include low back pain x 2-3 weeks.  No known aggravating or alleviating factors. Pt denies fever, chills, headache, neck pain, chest pain, SOB, abd pain, N/V/D, weakness, dizziness, syncope.  Pt reports 3 female sexual partners in the last 6 mos.  Pt currently with nexplanon as birth control.  Denies known STD exposure.      Past Medical History  Diagnosis Date  . UTI (lower urinary tract infection)   . Yeast infection    Past Surgical History  Procedure Laterality Date  . Tonsillectomy    . Tonsillectomy    . Wisdonm teeth removed     No family history on file. Social History  Substance Use Topics  . Smoking status: Current Every Day Smoker    Types: Cigarettes  . Smokeless tobacco: None  . Alcohol Use: No   OB History    No data available     Review of Systems  Constitutional: Negative for fever, diaphoresis, appetite change, fatigue and unexpected weight change.  HENT: Negative for mouth sores.   Eyes: Negative for visual disturbance.  Respiratory: Negative for cough, chest tightness, shortness of breath and wheezing.   Cardiovascular: Negative for chest pain.  Gastrointestinal: Negative for nausea, vomiting, abdominal pain, diarrhea and constipation.  Endocrine: Negative for polydipsia, polyphagia and polyuria.  Genitourinary: Positive for dysuria. Negative for urgency, frequency and  hematuria.  Musculoskeletal: Positive for back pain (low). Negative for neck stiffness.  Skin: Negative for rash.  Allergic/Immunologic: Negative for immunocompromised state.  Neurological: Negative for syncope, light-headedness and headaches.  Hematological: Does not bruise/bleed easily.  Psychiatric/Behavioral: Negative for sleep disturbance. The patient is not nervous/anxious.       Allergies  Fish allergy and Latex  Home Medications   Prior to Admission medications   Medication Sig Start Date End Date Taking? Authorizing Provider  diphenhydrAMINE (BENADRYL) 25 MG tablet Take 25 mg by mouth every 6 (six) hours as needed for itching.   Yes Historical Provider, MD  ibuprofen (ADVIL,MOTRIN) 200 MG tablet Take 200 mg by mouth every 6 (six) hours as needed for moderate pain.   Yes Historical Provider, MD  metroNIDAZOLE (METROGEL VAGINAL) 0.75 % vaginal gel Place 1 Applicatorful vaginally 2 (two) times daily. 11/03/15   Sabir Charters, PA-C   BP 133/64 mmHg  Pulse 94  Temp(Src) 98.5 F (36.9 C) (Oral)  Resp 14  Ht 5\' 9"  (1.753 m)  Wt 146.767 kg  BMI 47.76 kg/m2  SpO2 100%  LMP 10/20/2015 (Approximate) Physical Exam  Constitutional: She appears well-developed and well-nourished. No distress.  Awake, alert, nontoxic appearance  HENT:  Head: Normocephalic and atraumatic.  Mouth/Throat: Oropharynx is clear and moist. No oropharyngeal exudate.  Eyes: Conjunctivae are normal. No scleral icterus.  Neck: Normal range of motion. Neck supple.  Full ROM without pain  Cardiovascular: Normal rate, regular rhythm, normal heart sounds and intact distal pulses.   No murmur heard. Pulmonary/Chest: Effort normal and breath  sounds normal. No respiratory distress. She has no wheezes.  Equal chest expansion  Abdominal: Soft. Bowel sounds are normal. She exhibits no distension and no mass. There is no tenderness. There is no rebound and no guarding. Hernia confirmed negative in the right  inguinal area and confirmed negative in the left inguinal area.  Genitourinary: Uterus normal. No labial fusion. There is no rash, tenderness or lesion on the right labia. There is no rash, tenderness or lesion on the left labia. Uterus is not deviated, not enlarged, not fixed and not tender. Cervix exhibits no motion tenderness, no discharge and no friability. Right adnexum displays no mass, no tenderness and no fullness. Left adnexum displays no mass, no tenderness and no fullness. No erythema, tenderness or bleeding in the vagina. No foreign body around the vagina. No signs of injury around the vagina. Vaginal discharge (scant, white, malodorous ) found.  Clitoral piercing noted; no infection  Musculoskeletal: Normal range of motion. She exhibits no edema.  Full range of motion of the T-spine and L-spine No tenderness to palpation of the spinous processes of the T-spine or L-spine No tenderness to palpation of the paraspinous muscles of the L-spine  Lymphadenopathy:    She has no cervical adenopathy.       Right: No inguinal adenopathy present.       Left: No inguinal adenopathy present.  Neurological: She is alert. She has normal reflexes.  Reflex Scores:      Bicep reflexes are 2+ on the right side and 2+ on the left side.      Brachioradialis reflexes are 2+ on the right side and 2+ on the left side.      Patellar reflexes are 2+ on the right side and 2+ on the left side.      Achilles reflexes are 2+ on the right side and 2+ on the left side. Speech is clear and goal oriented Moves extremities without ataxia  Skin: Skin is warm and dry. No rash noted. She is not diaphoretic. No erythema.  Psychiatric: She has a normal mood and affect. Her behavior is normal.  Nursing note and vitals reviewed.   ED Course  Procedures (including critical care time) Labs Review Labs Reviewed  WET PREP, GENITAL - Abnormal; Notable for the following:    Clue Cells Wet Prep HPF POC PRESENT (*)    WBC,  Wet Prep HPF POC FEW (*)    All other components within normal limits  CBC - Abnormal; Notable for the following:    WBC 10.7 (*)    Hemoglobin 11.4 (*)    MCH 25.6 (*)    RDW 16.2 (*)    All other components within normal limits  URINALYSIS, ROUTINE W REFLEX MICROSCOPIC (NOT AT [facility]) - Abnormal; Notable for the following:    APPearance CLOUDY (*)    All other components within normal limits  BASIC METABOLIC PANEL  POC URINE PREG, ED  GC/CHLAMYDIA PROBE AMP ([place]) NOT AT [facility]     MDM   Final diagnoses:  Dysuria  BV (bacterial vaginosis)   Kendalynn Gola percent with complaints of dysuria. Labs reassuring. Mild leukocytosis at 10.7. Urinalysis shows cloudy urine but no evidence of urinary tract infection. Wet prep with clue cells and no yeast. Discharge not consistent with yeast. Will treat for BV.  Abdomen soft and nontender. No flank pain.  No CMT, no concern for PID.    Patient also with low back pain.  No neurological deficits and normal  neuro exam.  No loss of bowel or bladder control.  No concern for cauda equina.  No fever, night sweats, weight loss, h/o cancer, IVDU.    VSS.  Pt to follow-up with OB/GYN or PCP.  She states understanding and in agreement with plan.    BP 108/51 mmHg  Pulse 84  Temp(Src) 98.5 F (36.9 C) (Oral)  Resp 14  Ht  (1.753 m)  Wt 146.767 kg  BMI 47.76 kg/m2  SpO2 100%  LMP 10/20/2015 (Approximate)   [facility], PA-C 11/03/15 0016  Benjiman Core, MD 11/03/15 [phone]

## 2015-11-03 LAB — WET PREP, GENITAL
Sperm: NONE SEEN
Trich, Wet Prep: NONE SEEN
Yeast Wet Prep HPF POC: NONE SEEN

## 2015-11-03 MED ORDER — METRONIDAZOLE 0.75 % VA GEL: 1.0000 | Freq: Two times a day (BID) | VAGINAL | Status: DC

## 2015-11-03 NOTE — Discharge Instructions (Signed)
1. Medications: Metrogel, usual home medications 2. Treatment: rest, drink plenty of fluids,  3. Follow Up: Please followup with your primary doctor or OB/GYN in 7-10 days for discussion of your diagnoses and further evaluation after today's visit; if you do not have a primary care doctor use the resource guide provided to find one; Please return to the ER for worsening symptoms, fevers, persistent vomiting.      Bacterial Vaginosis Bacterial vaginosis is a vaginal infection that occurs when the normal balance of bacteria in the vagina is disrupted. It results from an overgrowth of certain bacteria. This is the most common vaginal infection in women of childbearing age. Treatment is important to prevent complications, especially in pregnant women, as it can cause a premature delivery. CAUSES  Bacterial vaginosis is caused by an increase in harmful bacteria that are normally present in smaller amounts in the vagina. Several different kinds of bacteria can cause bacterial vaginosis. However, the reason that the condition develops is not fully understood. RISK FACTORS Certain activities or behaviors can put you at an increased risk of developing bacterial vaginosis, including:  Having a new sex partner or multiple sex partners.  Douching.  Using an intrauterine device (IUD) for contraception. Women do not get bacterial vaginosis from toilet seats, bedding, swimming pools, or contact with objects around them. SIGNS AND SYMPTOMS  Some women with bacterial vaginosis have no signs or symptoms. Common symptoms include:  Grey vaginal discharge.  A fishlike odor with discharge, especially after sexual intercourse.  Itching or burning of the vagina and vulva.  Burning or pain with urination. DIAGNOSIS  Your health care provider will take a medical history and examine the vagina for signs of bacterial vaginosis. A sample of vaginal fluid may be taken. Your health care provider will look at this  sample under a microscope to check for bacteria and abnormal cells. A vaginal pH test may also be done.  TREATMENT  Bacterial vaginosis may be treated with antibiotic medicines. These may be given in the form of a pill or a vaginal cream. A second round of antibiotics may be prescribed if the condition comes back after treatment. Because bacterial vaginosis increases your risk for sexually transmitted diseases, getting treated can help reduce your risk for chlamydia, gonorrhea, HIV, and herpes. HOME CARE INSTRUCTIONS   Only take over-the-counter or prescription medicines as directed by your health care provider.  If antibiotic medicine was prescribed, take it as directed. Make sure you finish it even if you start to feel better.  Tell all sexual partners that you have a vaginal infection. They should see their health care provider and be treated if they have problems, such as a mild rash or itching.  During treatment, it is important that you follow these instructions:  Avoid sexual activity or use condoms correctly.  Do not douche.  Avoid alcohol as directed by your health care provider.  Avoid breastfeeding as directed by your health care provider. SEEK MEDICAL CARE IF:   Your symptoms are not improving after 3 days of treatment.  You have increased discharge or pain.  You have a fever. MAKE SURE YOU:   Understand these instructions.  Will watch your condition.  Will get help right away if you are not doing well or get worse. FOR MORE INFORMATION  Centers for Disease Control and Prevention, Division of STD Prevention: [employer] American Sexual Health Association (ASHA): www.ashastd.org    This information is not intended to replace advice given to you by  your health care provider. Make sure you discuss any questions you have with your health care provider.   Document Released: 06/15/2005 Document Revised: 07/06/2014 Document Reviewed: 01/25/2013 Elsevier Interactive  Patient Education [employer]2016 Elsevier Inc.

## 2015-11-04 ENCOUNTER — Emergency Department (HOSPITAL_COMMUNITY)
Admission: EM | Admit: 2015-11-04 | Discharge: 2015-11-04 | Disposition: A | Discharge status: Home / Self Care | Payer: Medicaid Other | Attending: Emergency Medicine | Admitting: Emergency Medicine

## 2015-11-04 ENCOUNTER — Emergency Department (HOSPITAL_COMMUNITY): Payer: Medicaid Other

## 2015-11-04 ENCOUNTER — Encounter (HOSPITAL_COMMUNITY): Payer: Self-pay | Admitting: *Deleted

## 2015-11-04 DIAGNOSIS — F17210 Nicotine dependence, cigarettes, uncomplicated: Secondary | ICD-10-CM | POA: Diagnosis not present

## 2015-11-04 DIAGNOSIS — Z8619 Personal history of other infectious and parasitic diseases: Secondary | ICD-10-CM | POA: Diagnosis not present

## 2015-11-04 DIAGNOSIS — R109 Unspecified abdominal pain: Secondary | ICD-10-CM

## 2015-11-04 DIAGNOSIS — Z87440 Personal history of urinary (tract) infections: Secondary | ICD-10-CM | POA: Insufficient documentation

## 2015-11-04 DIAGNOSIS — K529 Noninfective gastroenteritis and colitis, unspecified: Secondary | ICD-10-CM | POA: Diagnosis not present

## 2015-11-04 DIAGNOSIS — Z3202 Encounter for pregnancy test, result negative: Secondary | ICD-10-CM | POA: Insufficient documentation

## 2015-11-04 DIAGNOSIS — Z91040 Latex allergy status: Secondary | ICD-10-CM | POA: Insufficient documentation

## 2015-11-04 LAB — URINE MICROSCOPIC-ADD ON: WBC, UA: NONE SEEN WBC/hpf (ref 0–5)

## 2015-11-04 LAB — I-STAT BETA HCG BLOOD, ED (MC, WL, AP ONLY)

## 2015-11-04 LAB — URINALYSIS, ROUTINE W REFLEX MICROSCOPIC
BILIRUBIN URINE: NEGATIVE
Glucose, UA: NEGATIVE mg/dL
HGB URINE DIPSTICK: NEGATIVE
KETONES UR: NEGATIVE mg/dL
Leukocytes, UA: NEGATIVE
Nitrite: NEGATIVE
Protein, ur: NEGATIVE mg/dL
SPECIFIC GRAVITY, URINE: 1.025 (ref 1.005–1.030)
pH: 5 (ref 5.0–8.0)

## 2015-11-04 LAB — COMPREHENSIVE METABOLIC PANEL
ALBUMIN: 3.9 g/dL (ref 3.5–5.0)
ALK PHOS: 103 U/L (ref 38–126)
ALT: 18 U/L (ref 14–54)
AST: 18 U/L (ref 15–41)
Anion gap: 9 (ref 5–15)
BILIRUBIN TOTAL: 0.5 mg/dL (ref 0.3–1.2)
BUN: 9 mg/dL (ref 6–20)
CALCIUM: 9.3 mg/dL (ref 8.9–10.3)
CO2: 23 mmol/L (ref 22–32)
CREATININE: 0.57 mg/dL (ref 0.44–1.00)
Chloride: 108 mmol/L (ref 101–111)
GFR calc Af Amer: >60 mL/min (ref >60)
GFR calc non Af Amer: >60 mL/min (ref >60)
GLUCOSE: 108 mg/dL — AB (ref 65–99)
Potassium: 4.6 mmol/L (ref 3.5–5.1)
SODIUM: 140 mmol/L (ref 135–145)
TOTAL PROTEIN: 7.4 g/dL (ref 6.5–8.1)

## 2015-11-04 LAB — CBC
HCT: 41 % (ref 36.0–46.0)
HEMOGLOBIN: 12.8 g/dL (ref 12.0–15.0)
MCH: 25.5 pg — AB (ref 26.0–34.0)
MCHC: 31.2 g/dL (ref 30.0–36.0)
MCV: 81.7 fL (ref 78.0–100.0)
PLATELETS: 221 10*3/uL (ref 150–400)
RBC: 5.02 MIL/uL (ref 3.87–5.11)
RDW: 16.2 % — AB (ref 11.5–15.5)
WBC: 13.1 10*3/uL — ABNORMAL HIGH (ref 4.0–10.5)

## 2015-11-04 LAB — POC OCCULT BLOOD, ED: Fecal Occult Bld: NEGATIVE

## 2015-11-04 LAB — LIPASE, BLOOD: Lipase: 19 U/L (ref 11–51)

## 2015-11-04 LAB — GC/CHLAMYDIA PROBE AMP (~~LOC~~) NOT AT ARMC
CHLAMYDIA, DNA PROBE: NEGATIVE
Neisseria Gonorrhea: NEGATIVE

## 2015-11-04 MED ORDER — ONDANSETRON 4 MG PO TBDP
ORAL_TABLET | ORAL | Status: AC
  Filled 2015-11-04: qty 1

## 2015-11-04 MED ORDER — ONDANSETRON 4 MG PO TBDP
4.0000 mg | ORAL_TABLET | Freq: Once | ORAL | Status: AC | PRN
  Administered 2015-11-04: 4 mg via ORAL

## 2015-11-04 MED ORDER — IOPAMIDOL (ISOVUE-300) INJECTION 61%
INTRAVENOUS | Status: AC
  Filled 2015-11-04: qty 100

## 2015-11-04 MED ORDER — ONDANSETRON HCL 4 MG/2ML IJ SOLN
INTRAMUSCULAR | Status: AC
  Administered 2015-11-04: 4 mg
  Filled 2015-11-04: qty 2

## 2015-11-04 MED ORDER — ONDANSETRON HCL 4 MG/2ML IJ SOLN
4.0000 mg | Freq: Once | INTRAMUSCULAR | Status: AC
  Administered 2015-11-04: 4 mg via INTRAVENOUS
  Filled 2015-11-04: qty 2

## 2015-11-04 MED ORDER — ONDANSETRON 4 MG PO TBDP: ORAL_TABLET | ORAL | Status: DC

## 2015-11-04 MED ORDER — SODIUM CHLORIDE 0.9 % IV SOLN
Freq: Once | INTRAVENOUS | Status: AC
  Administered 2015-11-04: 17:00:00 via INTRAVENOUS

## 2015-11-04 NOTE — Discharge Instructions (Signed)
Abdominal Pain, Adult °Many things can cause abdominal pain. Usually, abdominal pain is not caused by a disease and will improve without treatment. It can often be observed and treated at home. Your health care provider will do a physical exam and possibly order blood tests and X-rays to help determine the seriousness of your pain. However, in many cases, more time must pass before a clear cause of the pain can be found. Before that point, your health care provider may not know if you need more testing or further treatment. °HOME CARE INSTRUCTIONS °Monitor your abdominal pain for any changes. The following actions may help to alleviate any discomfort you are experiencing: °· Only take over-the-counter or prescription medicines as directed by your health care provider. °· Do not take laxatives unless directed to do so by your health care provider. °· Try a clear liquid diet (broth, tea, or water) as directed by your health care provider. Slowly move to a bland diet as tolerated. °SEEK MEDICAL CARE IF: °· You have unexplained abdominal pain. °· You have abdominal pain associated with nausea or diarrhea. °· You have pain when you urinate or have a bowel movement. °· You experience abdominal pain that wakes you in the night. °· You have abdominal pain that is worsened or improved by eating food. °· You have abdominal pain that is worsened with eating fatty foods. °· You have a fever. °SEEK IMMEDIATE MEDICAL CARE IF: °· Your pain does not go away within 2 hours. °· You keep throwing up (vomiting). °· Your pain is felt only in portions of the abdomen, such as the right side or the left lower portion of the abdomen. °· You pass bloody or black tarry stools. °MAKE SURE YOU: °· Understand these instructions. °· Will watch your condition. °· Will get help right away if you are not doing well or get worse. °  °This information is not intended to replace advice given to you by your health care provider. Make sure you discuss  any questions you have with your health care provider. °  °Document Released: 03/25/2005 Document Revised: 03/06/2015 Document Reviewed: 02/22/2013 °Elsevier Interactive Patient Education ©2016 Elsevier Inc. ° °Viral Gastroenteritis °Viral gastroenteritis is also known as stomach flu. This condition affects the stomach and intestinal tract. It can cause sudden diarrhea and vomiting. The illness typically lasts 3 to 8 days. Most people develop an immune response that eventually gets rid of the virus. While this natural response develops, the virus can make you quite ill. °CAUSES  °Many different viruses can cause gastroenteritis, such as rotavirus or noroviruses. You can catch one of these viruses by consuming contaminated food or water. You may also catch a virus by sharing utensils or other personal items with an infected person or by touching a contaminated surface. °SYMPTOMS  °The most common symptoms are diarrhea and vomiting. These problems can cause a severe loss of body fluids (dehydration) and a body salt (electrolyte) imbalance. Other symptoms may include: °· Fever. °· Headache. °· Fatigue. °· Abdominal pain. °DIAGNOSIS  °Your caregiver can usually diagnose viral gastroenteritis based on your symptoms and a physical exam. A stool sample may also be taken to test for the presence of viruses or other infections. °TREATMENT  °This illness typically goes away on its own. Treatments are aimed at rehydration. The most serious cases of viral gastroenteritis involve vomiting so severely that you are not able to keep fluids down. In these cases, fluids must be given through an intravenous line (IV). °  HOME CARE INSTRUCTIONS  °· Drink enough fluids to keep your urine clear or pale yellow. Drink small amounts of fluids frequently and increase the amounts as tolerated. °· Ask your caregiver for specific rehydration instructions. °· Avoid: °¨ Foods high in sugar. °¨ Alcohol. °¨ Carbonated  drinks. °¨ Tobacco. °¨ Juice. °¨ Caffeine drinks. °¨ Extremely hot or cold fluids. °¨ Fatty, greasy foods. °¨ Too much intake of anything at one time. °¨ Dairy products until 24 to 48 hours after diarrhea stops. °· You may consume probiotics. Probiotics are active cultures of beneficial bacteria. They may lessen the amount and number of diarrheal stools in adults. Probiotics can be found in yogurt with active cultures and in supplements. °· Wash your hands well to avoid spreading the virus. °· Only take over-the-counter or prescription medicines for pain, discomfort, or fever as directed by your caregiver. Do not give aspirin to children. Antidiarrheal medicines are not recommended. °· Ask your caregiver if you should continue to take your regular prescribed and over-the-counter medicines. °· Keep all follow-up appointments as directed by your caregiver. °SEEK IMMEDIATE MEDICAL CARE IF:  °· You are unable to keep fluids down. °· You do not urinate at least once every 6 to 8 hours. °· You develop shortness of breath. °· You notice blood in your stool or vomit. This may look like coffee grounds. °· You have abdominal pain that increases or is concentrated in one small area (localized). °· You have persistent vomiting or diarrhea. °· You have a fever. °· The patient is a child younger than 3 months, and he or she has a fever. °· The patient is a child older than 3 months, and he or she has a fever and persistent symptoms. °· The patient is a child older than 3 months, and he or she has a fever and symptoms suddenly get worse. °· The patient is a baby, and he or she has no tears when crying. °MAKE SURE YOU:  °· Understand these instructions. °· Will watch your condition. °· Will get help right away if you are not doing well or get worse. °  °This information is not intended to replace advice given to you by your health care provider. Make sure you discuss any questions you have with your health care provider. °   °Document Released: 06/15/2005 Document Revised: 09/07/2011 Document Reviewed: 04/01/2011 °Elsevier Interactive Patient Education ©2016 Elsevier Inc. ° °

## 2015-11-04 NOTE — ED Notes (Signed)
Pt resting, friend at bedside.  

## 2015-11-04 NOTE — ED Provider Notes (Signed)
CSN: [id number]     Arrival date & time 11/04/15  1043 History  By signing my name below, I, Nichole Carlson, attest that this documentation has been prepared under the direction and in the presence of non-physician practitioner, Felicie Morn, NP. Electronically Signed: Freida Carlson, Scribe. 11/04/2015. 12:56 PM.    Chief Complaint  Patient presents with  . Abdominal Pain    Patient is a 20 y.o. female presenting with diarrhea. The history is provided by the patient. No language interpreter was used.  Diarrhea Quality:  Watery Severity:  Moderate Timing:  Sporadic Progression:  Unchanged Relieved by:  None tried Associated symptoms: abdominal pain, chills and vomiting   Associated symptoms: no fever      HPI Comments:  Nichole Carlson is a 20 y.o. female who presents to the Emergency Department complaining of "watery, dark" diarrhea since this AM. She reports associated chills, lightheadedness, nausea, vomiting and abdominal pain. Pt notes her abdominal pain is worse pain with diarrhea or when vomiting. No alleviating factors noted. She was seen in the ED on 11/02/15 for dysuria and diagnosed with BV. She was discharged with prescriptions which she has not yet filled .   Past Medical History  Diagnosis Date  . UTI (lower urinary tract infection)   . Yeast infection    Past Surgical History  Procedure Laterality Date  . Tonsillectomy    . Tonsillectomy    . Wisdonm teeth removed     No family history on file. Social History  Substance Use Topics  . Smoking status: Current Every Day Smoker    Types: Cigarettes  . Smokeless tobacco: None  . Alcohol Use: No   OB History    No data available     Review of Systems  Constitutional: Positive for chills. Negative for fever.  Gastrointestinal: Positive for nausea, vomiting, abdominal pain and diarrhea.  Neurological: Positive for light-headedness.  All other systems reviewed and are negative.  Allergies  Fish allergy and  Latex  Home Medications   Prior to Admission medications   Medication Sig Start Date End Date Taking? Authorizing Provider  diphenhydrAMINE (BENADRYL) 25 MG tablet Take 25 mg by mouth every 6 (six) hours as needed for itching.    Historical Provider, MD  ibuprofen (ADVIL,MOTRIN) 200 MG tablet Take 200 mg by mouth every 6 (six) hours as needed for moderate pain.    Historical Provider, MD  metroNIDAZOLE (METROGEL VAGINAL) 0.75 % vaginal gel Place 1 Applicatorful vaginally 2 (two) times daily. 11/03/15   Hannah Muthersbaugh, PA-C   BP 122/67 mmHg  Pulse 78  Temp(Src) 97.8 F (36.6 C) (Oral)  Resp 17  Ht  (1.753 m)  Wt 325 lb (147.419 kg)  BMI 47.97 kg/m2  SpO2 98%  LMP 10/20/2015 (Approximate) Physical Exam  Constitutional: She is oriented to person, place, and time. She appears well-developed and well-nourished. No distress.  HENT:  Head: Normocephalic and atraumatic.  Eyes: Conjunctivae are normal.  Cardiovascular: Normal rate.   Pulmonary/Chest: Effort normal.  Abdominal: Soft. There is tenderness.  diffuse abdominal tendernes Non-localized to absent bowel sounds  Neurological: She is alert and oriented to person, place, and time.  Skin: Skin is warm and dry.  Psychiatric: She has a normal mood and affect.  Nursing note and vitals reviewed.   ED Course  Procedures   DIAGNOSTIC STUDIES:  Oxygen Saturation is 98% on RA, normal by my interpretation.    COORDINATION OF CARE:  12:54 PM Discussed treatment plan with pt at  bedside and pt agreed to plan.  Labs Review Labs Reviewed  COMPREHENSIVE METABOLIC PANEL - Abnormal; Notable for the following:    Glucose, Bld 108 (*)    All other components within normal limits  CBC - Abnormal; Notable for the following:    WBC 13.1 (*)    MCH 25.5 (*)    RDW 16.2 (*)    All other components within normal limits  URINALYSIS, ROUTINE W REFLEX MICROSCOPIC (NOT AT [facility]) - Abnormal; Notable for the following:    APPearance  TURBID (*)    All other components within normal limits  URINE MICROSCOPIC-ADD ON - Abnormal; Notable for the following:    Squamous Epithelial / LPF 0-5 (*)    Bacteria, UA MANY (*)    Crystals URIC ACID CRYSTALS (*)    All other components within normal limits  LIPASE, BLOOD  I-STAT BETA HCG BLOOD, ED ([facility], [facility], [facility] ONLY)  POC OCCULT BLOOD, ED    Imaging Review Ct Abdomen Pelvis W Contrast  11/04/2015  CLINICAL DATA:  Watery dark diarrhea since this morning. Chills and lightheadedness. Abdominal pain. EXAM: CT ABDOMEN AND PELVIS WITH CONTRAST TECHNIQUE: Multidetector CT imaging of the abdomen and pelvis was performed using the standard protocol following bolus administration of intravenous contrast. CONTRAST:  100 mL Isovue-300 COMPARISON:  None. FINDINGS: Lower chest: 2 mm punctate nodule at the left lung base on sequence 2, image 26 is likely an incidental finding for patient of this age. Otherwise, the lung bases are clear. Hepatobiliary: Normal appearance of the liver, gallbladder and portal venous system. Pancreas: Normal appearance of the pancreas without inflammation or duct dilatation. Spleen: Normal appearance of spleen without enlargement Adrenals/Urinary Tract: Normal adrenal glands. There is a 4 mm stone in the left kidney interpolar region without hydronephrosis. No suspicious kidney findings. Normal appearance of the urinary bladder. Stomach/Bowel: Fluid-filled loops of small bowel without significant dilatation. Normal appearance of stomach and colon. Evidence for a small normal appearing appendix. No evidence for a bowel obstruction. There is also fluid in the distal sigmoid colon and rectum. Vascular/Lymphatic: Normal caliber of the abdominal aorta. No suspicious lymphadenopathy. Reproductive: Normal appearance of the uterus and ovaries. Other: No free fluid.  No free air. Musculoskeletal: No acute abnormality. IMPRESSION: Nonobstructive left kidney stone. Fluid-filled bowel loops. No  evidence for a bowel obstruction and no bowel inflammation. Electronically Signed   By: Richarda OverlieAdam  Henn M.D.   On: 11/04/2015 16:09   I have personally reviewed and evaluated these images and lab results as part of my medical decision-making.   Patient feels better after IV fluids and medication. Tolerating po fluids. MDM   Final diagnoses:  None  Gastroenteritis. Patient with symptoms consistent with viral gastroenteritis.  Vitals are stable, no fever.  No signs of dehydration, tolerating PO fluids > 6 oz.  Lungs are clear.  No focal abdominal pain, no concern for appendicitis, cholecystitis, pancreatitis, ruptured viscus, UTI, kidney stone, or any other abdominal etiology.  Supportive therapy indicated with return if symptoms worsen.  Patient counseled.    I personally performed the services described in this documentation, which was scribed in my presence. The recorded information has been reviewed and is accurate.    Felicie Mornavid Trayce Caravello, NP 11/04/15 [phone]  Vanetta MuldersScott Zackowski, MD 11/05/15 [phone]

## 2015-11-04 NOTE — ED Notes (Signed)
Pt is here with vomiting and black diarrhea that started this am.  Pt states she feels dizzy and nauseated. PT complains of belly pain.

## 2015-11-04 NOTE — ED Notes (Signed)
Pt requesting something for nausea.  Zofran 4mg  IV given for same

## 2015-11-04 NOTE — ED Notes (Signed)
Pt vomting at this time PA  Felicie Mornavid Smith aware , order for iv zofran given and med was given

## 2015-11-04 NOTE — ED Notes (Signed)
IV bolus continues to infuse at this time

## 2015-11-04 NOTE — ED Notes (Signed)
Pt called out x2 for nasuea medication and ginger ale. Onalee Huaavid, NP made aware, sts "we will wait until the results come back."

## 2015-11-04 NOTE — ED Notes (Signed)
Pt to CT at this time.

## 2015-11-04 NOTE — ED Notes (Addendum)
N/v/d since this am abd pain states she has not gotten her rx yet from being seen  2 days ago

## 2015-12-22 ENCOUNTER — Emergency Department
Admission: EM | Admit: 2015-12-22 | Discharge: 2015-12-22 | Disposition: A | Discharge status: Home / Self Care | Payer: Medicaid Other | Attending: Emergency Medicine | Admitting: Emergency Medicine

## 2015-12-22 ENCOUNTER — Encounter: Payer: Self-pay | Admitting: Emergency Medicine

## 2015-12-22 ENCOUNTER — Other Ambulatory Visit: Payer: Self-pay

## 2015-12-22 DIAGNOSIS — F1290 Cannabis use, unspecified, uncomplicated: Secondary | ICD-10-CM | POA: Diagnosis not present

## 2015-12-22 DIAGNOSIS — Z91040 Latex allergy status: Secondary | ICD-10-CM | POA: Diagnosis not present

## 2015-12-22 DIAGNOSIS — Z79899 Other long term (current) drug therapy: Secondary | ICD-10-CM | POA: Diagnosis not present

## 2015-12-22 DIAGNOSIS — R42 Dizziness and giddiness: Secondary | ICD-10-CM | POA: Diagnosis not present

## 2015-12-22 DIAGNOSIS — Z91013 Allergy to seafood: Secondary | ICD-10-CM | POA: Diagnosis not present

## 2015-12-22 DIAGNOSIS — F17210 Nicotine dependence, cigarettes, uncomplicated: Secondary | ICD-10-CM | POA: Diagnosis not present

## 2015-12-22 LAB — GLUCOSE, CAPILLARY: Glucose-Capillary: 79 mg/dL (ref 65–99)

## 2015-12-22 MED ORDER — MECLIZINE HCL 25 MG PO TABS: 25.0000 mg | ORAL_TABLET | Freq: Three times a day (TID) | ORAL | Status: DC | PRN

## 2015-12-22 MED ORDER — MECLIZINE HCL 25 MG PO TABS
25.0000 mg | ORAL_TABLET | Freq: Once | ORAL | Status: AC
  Administered 2015-12-22: 25 mg via ORAL
  Filled 2015-12-22: qty 1

## 2015-12-22 NOTE — Discharge Instructions (Signed)
Vertigo Vertigo means that you feel like you are moving when you are not. Vertigo can also make you feel like things around you are moving when they are not. This feeling can come and go at any time. Vertigo often goes away on its own. HOME CARE  Avoid making fast movements.  Avoid driving.  Avoid using heavy machinery.  Avoid doing any task or activity that might cause danger to you or other people if you would have a vertigo attack while you are doing it.  Sit down right away if you feel dizzy or have trouble with your balance.  Take over-the-counter and prescription medicines only as told by your doctor.  Follow instructions from your doctor about which positions or movements you should avoid.  Drink enough fluid to keep your pee (urine) clear or pale yellow.  Keep all follow-up visits as told by your doctor. This is important. GET HELP IF:  Medicine does not help your vertigo.  You have a fever.  Your problems get worse or you have new symptoms.  Your family or friends see changes in your behavior.  You feel sick to your stomach (nauseous) or you throw up (vomit).  You have a "pins and needles" feeling or you are numb in part of your body. GET HELP RIGHT AWAY IF:  You have trouble moving or talking.  You are always dizzy.  You pass out (faint).  You get very bad headaches.  You feel weak or have trouble using your hands, arms, or legs.  You have changes in your hearing.  You have changes in your seeing (vision).  You get a stiff neck.  Bright light starts to bother you.   This information is not intended to replace advice given to you by your health care provider. Make sure you discuss any questions you have with your health care provider.   Document Released: 03/24/2008 Document Revised: 03/06/2015 Document Reviewed: 10/08/2014 Elsevier Interactive Patient Education 2016 Elsevier Inc.  

## 2015-12-22 NOTE — ED Notes (Signed)
[place]. Patient resting quietly in darkened room. MD in to reevalute at this time.

## 2015-12-22 NOTE — ED Notes (Signed)
CBG 79 

## 2015-12-22 NOTE — ED Notes (Addendum)
Pt reports she was working and all of a sudden felt dizzy Mom states pt is also confused she asking questions like "what was I doing, why am I here" Alert and oriented x4 at this time. Denies drug or alcohol use today states she had a shot of liquor last night.

## 2015-12-22 NOTE — ED Provider Notes (Signed)
[facility] Emergency Department Provider Note   ____________________________________________  Time seen: Approximately 630 PM  I have reviewed the triage vital signs and the nursing notes.   HISTORY  Chief Complaint Dizziness   HPI Nichole Carlson is a 20 y.o. female without any chronic medical conditions was presenting to the emergency department with dizziness at work today. Per the patient, she was standing for about 4-1/2 hours when she began to feel the room spinning. He said that the episode lasted about 30 minutes. Her mother also witnessed the tail end of the episode and said the patient seemed disoriented. The patient is also been having right ear pressure. York SpanielSaid that this started about a month ago after an ear infection or she was given eardrops. The patient denies any pain at this time. Says that she did not feel as if she was going to pass out. Strong family history of vertigo including the mother.Denies any weakness or numbness.   Past Medical History  Diagnosis Date  . UTI (lower urinary tract infection)   . Yeast infection     There are no active problems to display for this patient.   Past Surgical History  Procedure Laterality Date  . Tonsillectomy    . Tonsillectomy    . Wisdonm teeth removed      Current Outpatient Rx  Name  Route  Sig  Dispense  Refill  . diphenhydrAMINE (BENADRYL) 25 MG tablet   Oral   Take 25 mg by mouth every 6 (six) hours as needed for itching.         Marland Kitchen. ibuprofen (ADVIL,MOTRIN) 200 MG tablet   Oral   Take 200 mg by mouth every 6 (six) hours as needed for moderate pain.         . metroNIDAZOLE (METROGEL VAGINAL) 0.75 % vaginal gel   Vaginal   Place 1 Applicatorful vaginally 2 (two) times daily.   70 g   0   . ondansetron (ZOFRAN ODT) 4 MG disintegrating tablet      4mg  ODT q4 hours prn nausea/vomit   4 tablet   0     Allergies Fish allergy and Latex  No family history on file.  Social  History Social History  Substance Use Topics  . Smoking status: Current Every Day Smoker    Types: Cigarettes  . Smokeless tobacco: None  . Alcohol Use: Yes     Comment: occasional    Review of Systems Constitutional: No fever/chills Eyes: No visual changes. ENT: No sore throat. Cardiovascular: Denies chest pain. Respiratory: Denies shortness of breath. Gastrointestinal: No abdominal pain.  No nausea, no vomiting.  No diarrhea.  No constipation. Genitourinary: Negative for dysuria. Musculoskeletal: Negative for back pain. Skin: Negative for rash. Neurological: Negative for headaches, focal weakness or numbness.  10-point ROS otherwise negative.  ____________________________________________   PHYSICAL EXAM:  VITAL SIGNS: ED Triage Vitals  Enc Vitals Group     BP 12/22/15 1809 124/41 mmHg     Pulse Rate 12/22/15 1809 51     Resp 12/22/15 1809 20     Temp 12/22/15 1809 98.1 F (36.7 C)     Temp Source 12/22/15 1809 Oral     SpO2 12/22/15 1809 100 %     Weight 12/22/15 1809 315 lb (142.883 kg)     Height 12/22/15 1809 5\' 9"  (1.753 m)     Head Cir --      Peak Flow --      Pain Score  12/22/15 1810 6     Pain Loc --      Pain Edu? --      Excl. in GC? --     Constitutional: Alert and oriented. Well appearing and in no acute distress. Eyes: Conjunctivae are normal. PERRL. EOMI. Head: Atraumatic.TMs are normal bilaterally. Nose: No congestion/rhinnorhea. Mouth/Throat: Mucous membranes are moist.   Neck: No stridor.   Cardiovascular: Normal rate, regular rhythm. Grossly normal heart sounds.   Respiratory: Normal respiratory effort.  No retractions. Lungs CTAB. Gastrointestinal: Soft and nontender. No distention. Musculoskeletal: No lower extremity tenderness nor edema.  No joint effusions. Neurologic:  Normal speech and language. No gross focal neurologic deficits are appreciated. No ataxia on finger to nose testing or heel to shin testing. No nystagmus. Skin:  Skin  is warm, dry and intact. No rash noted. Psychiatric: Mood and affect are normal. Speech and behavior are normal.  ____________________________________________   LABS (all labs ordered are listed, but only abnormal results are displayed)  Labs Reviewed  GLUCOSE, CAPILLARY   ____________________________________________  EKG  ED ECG REPORT I, Suan Pyeatt,  Teena Iraniavid M, the attending physician, personally viewed and interpreted this ECG.   Date: 12/22/2015  EKG Time: 1814  Rate: 60  Rhythm: normal sinus rhythm  Axis: Normal  Intervals:none  ST&T Change: No ST segment elevation or depression. No abnormal T-wave inversion.  ____________________________________________  RADIOLOGY   ____________________________________________   PROCEDURES  ____________________________________________   INITIAL IMPRESSION / ASSESSMENT AND PLAN / ED COURSE  Pertinent labs & imaging results that were available during my care of the patient were reviewed by me and considered in my medical decision making (see chart for details).  ----------------------------------------- 7:37 PM on 12/22/2015 -----------------------------------------  Patient with symptoms alleviated after taking meclizine. Able to walk with a normal gait without any assistance. The mother says that she, personally, has also felt disoriented during episodes of vertigo. Patient will [facility]. Likely vertiginous episode. No signs of central vertigo. ____________________________________________   FINAL CLINICAL IMPRESSION(S) / ED DIAGNOSES  Vertigo    NEW MEDICATIONS STARTED DURING THIS VISIT:  New Prescriptions   No medications on file     Note:  This document was prepared using Dragon voice recognition software and may include unintentional dictation errors.    Myrna Blazeravid Matthew Berline Semrad, MD 12/22/15 [phone]

## 2016-01-28 ENCOUNTER — Emergency Department
Admission: EM | Admit: 2016-01-28 | Discharge: 2016-01-28 | Disposition: A | Discharge status: Home / Self Care | Payer: Medicaid Other | Attending: Emergency Medicine | Admitting: Emergency Medicine

## 2016-01-28 ENCOUNTER — Encounter: Payer: Self-pay | Admitting: Medical Oncology

## 2016-01-28 DIAGNOSIS — N3090 Cystitis, unspecified without hematuria: Secondary | ICD-10-CM | POA: Insufficient documentation

## 2016-01-28 DIAGNOSIS — F17210 Nicotine dependence, cigarettes, uncomplicated: Secondary | ICD-10-CM | POA: Insufficient documentation

## 2016-01-28 LAB — URINALYSIS COMPLETE WITH MICROSCOPIC (ARMC ONLY)
BACTERIA UA: NONE SEEN
Bilirubin Urine: NEGATIVE
GLUCOSE, UA: NEGATIVE mg/dL
Hgb urine dipstick: NEGATIVE
Ketones, ur: NEGATIVE mg/dL
Nitrite: NEGATIVE
PROTEIN: NEGATIVE mg/dL
Specific Gravity, Urine: 1.015 (ref 1.005–1.030)
pH: 7 (ref 5.0–8.0)

## 2016-01-28 LAB — POCT PREGNANCY, URINE: PREG TEST UR: NEGATIVE

## 2016-01-28 MED ORDER — PHENAZOPYRIDINE HCL 200 MG PO TABS
ORAL_TABLET | ORAL | Status: AC
  Filled 2016-01-28: qty 1

## 2016-01-28 MED ORDER — PHENAZOPYRIDINE HCL 200 MG PO TABS
200.0000 mg | ORAL_TABLET | Freq: Once | ORAL | Status: AC
  Administered 2016-01-28: 200 mg via ORAL

## 2016-01-28 MED ORDER — PHENAZOPYRIDINE HCL 100 MG PO TABS: 200.0000 mg | ORAL_TABLET | Freq: Three times a day (TID) | ORAL | 0 refills | Status: DC | PRN

## 2016-01-28 MED ORDER — CEPHALEXIN 500 MG PO CAPS: 500.0000 mg | ORAL_CAPSULE | Freq: Four times a day (QID) | ORAL | 0 refills | Status: DC

## 2016-01-28 NOTE — Discharge Instructions (Signed)
Take all antibiotics until completely finished. Pyridium will cause a discoloration of your urine but will help with pain and bladder spasms. Increase fluids. Follow-up with [facility] clinic if any continued problems.

## 2016-01-28 NOTE — ED Provider Notes (Signed)
[facility] Emergency Department Provider Note  ____________________________________________   None    (approximate)  I have reviewed the triage vital signs and the nursing notes.   HISTORY  Chief Complaint Dysuria   HPI Nichole Carlson is a 20 y.o. female is here with complaint of dysuria since yesterday. Patient reports pain and frequency. She states she does have a history of urinary tract infections and that her last one may have been one year ago. Patient denies any known fever, denies nausea or vomiting. Patient denies any back pain. Currently she rates her discomfort as a 9/10. Patient states that the bladder spasms are most uncomfortable. She has not taken any over-the-counter medications for these.   Past Medical History:  Diagnosis Date  . UTI (lower urinary tract infection)   . Yeast infection     There are no active problems to display for this patient.   Past Surgical History:  Procedure Laterality Date  . TONSILLECTOMY    . TONSILLECTOMY    . wisdonm teeth removed      Prior to Admission medications   Medication Sig Start Date End Date Taking? Authorizing Provider  cephALEXin (KEFLEX) 500 MG capsule Take 1 capsule (500 mg total) by mouth 4 (four) times daily. 01/28/16   Tommi Rumps, PA-C  diphenhydrAMINE (BENADRYL) 25 MG tablet Take 25 mg by mouth every 6 (six) hours as needed for itching.    Historical Provider, MD  ibuprofen (ADVIL,MOTRIN) 200 MG tablet Take 200 mg by mouth every 6 (six) hours as needed for moderate pain.    Historical Provider, MD  phenazopyridine (PYRIDIUM) 100 MG tablet Take 2 tablets (200 mg total) by mouth 3 (three) times daily as needed for pain. 01/28/16 01/27/17  Tommi Rumps, PA-C    Allergies Fish allergy and Latex  No family history on file.  Social History Social History  Substance Use Topics  . Smoking status: Current Every Day Smoker    Types: Cigarettes  . Smokeless tobacco: Not on file    . Alcohol use Yes     Comment: occasional    Review of Systems Constitutional: No fever/chills Cardiovascular: Denies chest pain. Respiratory: Denies shortness of breath. Gastrointestinal: No abdominal pain.  No nausea, no vomiting.   Genitourinary:Positive for dysuria. Musculoskeletal: Negative for back pain. Skin: Negative for rash. Neurological: Negative for headaches, focal weakness or numbness.  10-point ROS otherwise negative.  ____________________________________________   PHYSICAL EXAM:  VITAL SIGNS: ED Triage Vitals  Enc Vitals Group     BP 01/28/16 1448 (!) 139/52     Pulse Rate 01/28/16 1448 62     Resp 01/28/16 1448 16     Temp 01/28/16 1448 97.8 F (36.6 C)     Temp Source 01/28/16 1448 Oral     SpO2 01/28/16 1448 100 %     Weight 01/28/16 1448 (!) 320 lb (145.2 kg)     Height 01/28/16 1448 5\' 9"  (1.753 m)     Head Circumference --      Peak Flow --      Pain Score 01/28/16 1449 9     Pain Loc --      Pain Edu? --      Excl. in GC? --     Constitutional: Alert and oriented. Well appearing and in no acute distress. Eyes: Conjunctivae are normal. PERRL. EOMI. Head: Atraumatic. Nose: No congestion/rhinnorhea. Neck: No stridor.   Cardiovascular: Normal rate, regular rhythm. Grossly normal heart sounds.  Good  peripheral circulation. Respiratory: Normal respiratory effort.  No retractions. Lungs CTAB. Gastrointestinal:   No CVA tenderness. Musculoskeletal: Moves upper and lower extremities without any difficulty. Normal gait was noted. Neurologic:  Normal speech and language. No gross focal neurologic deficits are appreciated. No gait instability. Skin:  Skin is warm, dry and intact. No rash noted. Psychiatric: Mood and affect are normal. Speech and behavior are normal.  ____________________________________________   LABS (all labs ordered are listed, but only abnormal results are displayed)  Labs Reviewed  URINALYSIS COMPLETEWITH MICROSCOPIC  ([facility] ONLY) - Abnormal; Notable for the following:       Result Value   Color, Urine YELLOW (*)    APPearance CLEAR (*)    Leukocytes, UA TRACE (*)    Squamous Epithelial / LPF 0-5 (*)    All other components within normal limits  URINE CULTURE  POC URINE PREG, ED  POCT PREGNANCY, URINE    PROCEDURES  Procedure(s) performed: None  Procedures  Critical Care performed: No  ____________________________________________   INITIAL IMPRESSION / ASSESSMENT AND PLAN / ED COURSE  Pertinent labs & imaging results that were available during my care of the patient were reviewed by me and considered in my medical decision making (see chart for details).    Clinical Course   Patient was given Pyridium 200 mg while in the emergency room for control of her bladder spasms as well as discomfort. Patient was discharged on a prescription of Keflex 500 mg 4 times a day for 10 days along with Pyridium 3 times a day as needed for dysuria. Patient is to follow-up with her primary care doctor if any continued problems. She is also encouraged to drink lots of fluids.  ____________________________________________   FINAL CLINICAL IMPRESSION(S) / ED DIAGNOSES  Final diagnoses:  Cystitis      NEW MEDICATIONS STARTED DURING THIS VISIT:  Discharge Medication List as of 01/28/2016  4:03 PM    START taking these medications   Details  cephALEXin (KEFLEX) 500 MG capsule Take 1 capsule (500 mg total) by mouth 4 (four) times daily., Starting Tue 01/28/2016, Print    phenazopyridine (PYRIDIUM) 100 MG tablet Take 2 tablets (200 mg total) by mouth 3 (three) times daily as needed for pain., Starting Tue 01/28/2016, Until Wed 01/27/2017, Print         Note:  This document was prepared using Dragon voice recognition software and may include unintentional dictation errors.    Tommi Rumps, PA-C 01/28/16 1614    Governor Rooks, MD 01/28/16 2045

## 2016-01-28 NOTE — ED Notes (Signed)
See triage note  states she developed some chills unsure of fever  With some painful urination

## 2016-01-28 NOTE — ED Triage Notes (Signed)
Pt ambulatory to triage with reports of pain with urination and frequency since yesterday. Has had UTI in past and feels exactly the same.

## 2016-01-31 LAB — URINE CULTURE
Culture: >=100000 — AB
Special Requests: NORMAL

## 2016-02-09 ENCOUNTER — Encounter (HOSPITAL_COMMUNITY): Payer: Self-pay | Admitting: Emergency Medicine

## 2016-02-09 ENCOUNTER — Emergency Department (HOSPITAL_COMMUNITY)
Admission: EM | Admit: 2016-02-09 | Discharge: 2016-02-09 | Disposition: A | Discharge status: Home / Self Care | Payer: Medicaid Other | Attending: Emergency Medicine | Admitting: Emergency Medicine

## 2016-02-09 DIAGNOSIS — R300 Dysuria: Secondary | ICD-10-CM | POA: Insufficient documentation

## 2016-02-09 DIAGNOSIS — Z91040 Latex allergy status: Secondary | ICD-10-CM | POA: Insufficient documentation

## 2016-02-09 DIAGNOSIS — F17210 Nicotine dependence, cigarettes, uncomplicated: Secondary | ICD-10-CM | POA: Insufficient documentation

## 2016-02-09 LAB — WET PREP, GENITAL
SPERM: NONE SEEN
Trich, Wet Prep: NONE SEEN
YEAST WET PREP: NONE SEEN

## 2016-02-09 LAB — URINALYSIS, ROUTINE W REFLEX MICROSCOPIC
Bilirubin Urine: NEGATIVE
Glucose, UA: NEGATIVE mg/dL
HGB URINE DIPSTICK: NEGATIVE
Ketones, ur: NEGATIVE mg/dL
Leukocytes, UA: NEGATIVE
Nitrite: NEGATIVE
PH: 6 (ref 5.0–8.0)
Protein, ur: NEGATIVE mg/dL
SPECIFIC GRAVITY, URINE: 1.029 (ref 1.005–1.030)

## 2016-02-09 LAB — POC URINE PREG, ED: PREG TEST UR: NEGATIVE

## 2016-02-09 MED ORDER — NAPROXEN 250 MG PO TABS
500.0000 mg | ORAL_TABLET | Freq: Once | ORAL | Status: AC
  Administered 2016-02-09: 500 mg via ORAL
  Filled 2016-02-09: qty 2

## 2016-02-09 MED ORDER — AZITHROMYCIN 250 MG PO TABS
1000.0000 mg | ORAL_TABLET | Freq: Once | ORAL | Status: AC
  Administered 2016-02-09: 1000 mg via ORAL
  Filled 2016-02-09: qty 4

## 2016-02-09 MED ORDER — PHENAZOPYRIDINE HCL 100 MG PO TABS: 200.0000 mg | ORAL_TABLET | Freq: Three times a day (TID) | ORAL | 0 refills | Status: DC | PRN

## 2016-02-09 MED ORDER — LIDOCAINE HCL (PF) 1 % IJ SOLN
0.9000 mL | Freq: Once | INTRAMUSCULAR | Status: AC
  Administered 2016-02-09: 0.9 mL
  Filled 2016-02-09: qty 5

## 2016-02-09 MED ORDER — CEFTRIAXONE SODIUM 250 MG IJ SOLR
250.0000 mg | Freq: Once | INTRAMUSCULAR | Status: AC
  Administered 2016-02-09: 250 mg via INTRAMUSCULAR
  Filled 2016-02-09: qty 250

## 2016-02-09 NOTE — ED Provider Notes (Signed)
[facility]-EMERGENCY DEPT Provider Note   CSN: [id number] Arrival date & time: 02/09/16  [id number]  First Provider Contact:  None       History   Chief Complaint Chief Complaint  Patient presents with  . Dysuria  . Abdominal Pain    HPI Tanna FurryLauren Vandeusen is a 20 y.o. female.  20 year old female withpast medical history presents to the emergency department for evaluation of suprapubic pressure-like discomfort which has been present for the past 2 days. She reports intermittent sharp pain sensations as well as some discomfort in her low back. Patient has noted some burning dysuria as well as frequency and urgency. She had similar symptoms on 01/28/2016 which led her to seek care at [facility]. She was diagnosed with a urinary tract infection which grew out Escherichia coli. She states that she completed a 10 day course of Keflex 2 days ago. She states she felt as though her symptoms never fully resolved. She denies any fever, nausea, vomiting, hematuria, or vaginal discharge. She has been sexually active with 5 partners in the last 6 months. She reports inconsistent use of condoms. She denies hx of frequent UTIs. She has never seen a urologist. She denies bathing, douching, or poor wiping technique. She does report voiding after intercourse consistently.   The history is provided by the patient. No language interpreter was used.  Dysuria    Abdominal Pain   Associated symptoms include dysuria.    Past Medical History:  Diagnosis Date  . UTI (lower urinary tract infection)   . Yeast infection     There are no active problems to display for this patient.   Past Surgical History:  Procedure Laterality Date  . TONSILLECTOMY    . TONSILLECTOMY    . wisdonm teeth removed      OB History    No data available       Home Medications    Prior to Admission medications   Medication Sig Start Date End Date Taking? Authorizing Provider  diphenhydrAMINE (BENADRYL) 25 MG  tablet Take 25 mg by mouth at bedtime as needed (seasonal allergies).    Yes Historical Provider, MD  etonogestrel (NEXPLANON) 68 MG IMPL implant 68 mg by Subdermal route once. Implanted November 2015   Yes Historical Provider, MD  ibuprofen (ADVIL,MOTRIN) 200 MG tablet Take 800 mg by mouth every 8 (eight) hours as needed for headache, moderate pain or cramping.    Yes Historical Provider, MD  meclizine (ANTIVERT) 25 MG tablet Take 25 mg by mouth 3 (three) times daily as needed for dizziness (vertigo).  01/28/16   Historical Provider, MD  phenazopyridine (PYRIDIUM) 100 MG tablet Take 2 tablets (200 mg total) by mouth 3 (three) times daily as needed (urination pain). 02/09/16 02/08/17  Antony MaduraKelly Denney Shein, PA-C    Family History History reviewed. No pertinent family history.  Social History Social History  Substance Use Topics  . Smoking status: Current Every Day Smoker    Types: Cigarettes  . Smokeless tobacco: Never Used  . Alcohol use Yes     Comment: occasional     Allergies   Fish allergy and Latex   Review of Systems Review of Systems  Gastrointestinal: Positive for abdominal pain.  Genitourinary: Positive for dysuria.  Ten systems reviewed and are negative for acute change, except as noted in the HPI.    Physical Exam Updated Vital Signs BP 132/76 (BP Location: Left Arm)   Pulse 66   Temp 98.2 F (36.8 C) (Oral)  Resp 18   LMP 01/30/2016 (Exact Date)   SpO2 100%   Physical Exam  Constitutional: She is oriented to person, place, and time. She appears well-developed and well-nourished. No distress.  Nontoxic/nonseptic appearing  HENT:  Head: Normocephalic and atraumatic.  Eyes: Conjunctivae and EOM are normal. No scleral icterus.  Neck: Normal range of motion.  Cardiovascular: Normal rate, regular rhythm and intact distal pulses.   Pulmonary/Chest: Effort normal. No respiratory distress. She has no wheezes.  Respirations even and unlabored. Lungs CTAB.  Abdominal: Soft.  She exhibits no distension and no mass. There is tenderness. There is no guarding.  Minimal suprapubic TTP. No masses or peritoneal signs. No significant CVA TTP.  Genitourinary: There is no rash, tenderness, lesion or injury on the right labia. There is no rash, tenderness, lesion or injury on the left labia. Uterus is not tender. Cervix exhibits no motion tenderness. Right adnexum displays no mass and no tenderness. Left adnexum displays no mass and no tenderness. No bleeding in the vagina. No signs of injury around the vagina.  Genitourinary Comments: Unremarkable GU exam. No CMT or adnexal TTP.  Musculoskeletal: Normal range of motion.  Neurological: She is alert and oriented to person, place, and time.  GCS 15. Patient moving all extremities.  Skin: Skin is warm and dry. No rash noted. She is not diaphoretic. No erythema. No pallor.  Psychiatric: She has a normal mood and affect. Her behavior is normal.  Nursing note and vitals reviewed.    ED Treatments / Results  Labs (all labs ordered are listed, but only abnormal results are displayed) Labs Reviewed  WET PREP, GENITAL - Abnormal; Notable for the following:       Result Value   Clue Cells Wet Prep HPF POC PRESENT (*)    WBC, Wet Prep HPF POC MANY (*)    All other components within normal limits  URINE CULTURE  URINALYSIS, ROUTINE W REFLEX MICROSCOPIC (NOT AT [facility])  POC URINE PREG, ED  GC/CHLAMYDIA PROBE AMP ([place]) NOT AT [facility]    EKG  EKG Interpretation None       Radiology No results found.  Procedures Procedures (including critical care time)  Medications Ordered in ED Medications  cefTRIAXone (ROCEPHIN) injection 250 mg (not administered)  azithromycin (ZITHROMAX) tablet 1,000 mg (not administered)  naproxen (NAPROSYN) tablet 500 mg (500 mg Oral Given 02/09/16 2159)     Initial Impression / Assessment and Plan / ED Course  I have reviewed the triage vital signs and the nursing notes.  Pertinent labs  & imaging results that were available during my care of the patient were reviewed by me and considered in my medical decision making (see chart for details).  Clinical Course    20 year old well-appearing and afebrile female presents to the emergency department for evaluation of persistent dysuria. She was evaluated on 01/28/2016 at [facility] and diagnosed with a urinary tract infection. She did have a urine culture that grew out Escherichia coli sensitive to Keflex. Patient reports completing a full course of antibiotics, but she feels as though she has continued to have burning with urination and frequency. Patient also complaining of some suprapubic discomfort. No evidence of acute surgical abdomen on exam.  Patient's urinalysis today does not suggest urinary tract infection. A repeat culture has been sent. Her pregnancy test is negative today. Patient's chart was reviewed which showed history of positive STD results in the setting of similar symptom complaints. A pelvic exam was performed  which was positive for many white blood cells. Given these findings, patient was covered prophylactically with Rocephin and azithromycin.  The patient has been instructed to follow-up with the health department in 2 days regarding the results of her STD tests and notify all sexual partners should they need to be tested and treated as well. Referral given to urology should patient continued to have persistent dysuria. Return precautions discussed and provided. Patient discharged in satisfactory condition with no unaddressed concerns.   Final Clinical Impressions(s) / ED Diagnoses   Final diagnoses:  Dysuria    New Prescriptions Current Discharge Medication List       Antony Madura, PA-C 02/09/16 2249    Eber Hong, MD 02/09/16 2303

## 2016-02-09 NOTE — ED Triage Notes (Signed)
Bladder spasms and painful urination for two weeks . Finished antibiotics 2 days ago; symptoms persist. Denies fever or chills.

## 2016-02-09 NOTE — Discharge Instructions (Signed)
You have been treated today for STDs. Follow-up with the health department in 2 days to obtain the results of your STD tests. Notify all sexual partners of their need to be tested and treated if your results are positive. Do not engage in sexual intercourse for one week following treatment today. If you continued to experience painful urination and frequency, we recommend that you follow-up with a urologist. Your urine today did not show signs of infection. You may return to the emergency department for any new or concerning symptoms.

## 2016-02-10 LAB — GC/CHLAMYDIA PROBE AMP (~~LOC~~) NOT AT ARMC
CHLAMYDIA, DNA PROBE: NEGATIVE
NEISSERIA GONORRHEA: NEGATIVE

## 2016-02-11 LAB — URINE CULTURE: CULTURE: NO GROWTH

## 2016-03-14 ENCOUNTER — Encounter (HOSPITAL_COMMUNITY): Payer: Self-pay

## 2016-03-14 ENCOUNTER — Emergency Department (HOSPITAL_COMMUNITY)
Admission: EM | Admit: 2016-03-14 | Discharge: 2016-03-14 | Disposition: A | Discharge status: Home / Self Care | Payer: Medicaid Other | Attending: Emergency Medicine | Admitting: Emergency Medicine

## 2016-03-14 DIAGNOSIS — R519 Headache, unspecified: Secondary | ICD-10-CM

## 2016-03-14 DIAGNOSIS — R51 Headache: Secondary | ICD-10-CM | POA: Insufficient documentation

## 2016-03-14 DIAGNOSIS — F17210 Nicotine dependence, cigarettes, uncomplicated: Secondary | ICD-10-CM | POA: Insufficient documentation

## 2016-03-14 DIAGNOSIS — Z91040 Latex allergy status: Secondary | ICD-10-CM | POA: Insufficient documentation

## 2016-03-14 MED ORDER — SODIUM CHLORIDE 0.9 % IV BOLUS (SEPSIS)
1000.0000 mL | Freq: Once | INTRAVENOUS | Status: AC
  Administered 2016-03-14: 1000 mL via INTRAVENOUS

## 2016-03-14 MED ORDER — DIPHENHYDRAMINE HCL 50 MG/ML IJ SOLN
25.0000 mg | Freq: Once | INTRAMUSCULAR | Status: AC
  Administered 2016-03-14: 25 mg via INTRAVENOUS
  Filled 2016-03-14: qty 1

## 2016-03-14 MED ORDER — KETOROLAC TROMETHAMINE 30 MG/ML IJ SOLN
30.0000 mg | Freq: Once | INTRAMUSCULAR | Status: AC
  Administered 2016-03-14: 30 mg via INTRAVENOUS
  Filled 2016-03-14: qty 1

## 2016-03-14 MED ORDER — PROCHLORPERAZINE EDISYLATE 5 MG/ML IJ SOLN
5.0000 mg | Freq: Once | INTRAMUSCULAR | Status: AC
  Administered 2016-03-14: 5 mg via INTRAVENOUS
  Filled 2016-03-14: qty 2

## 2016-03-14 NOTE — ED Provider Notes (Signed)
[facility]-EMERGENCY DEPT Provider Note   CSN: [id number] Arrival date & time: 03/14/16  1214     History   Chief Complaint Chief Complaint  Patient presents with  . Otalgia  . Migraine    HPI Nichole Carlson is a 20 y.o. female who presents with otalgia and headache. Patient reports symptoms have been present intermittently over the past one week and have been constant over the past 3-4 days. She describes a deep, sharp pain in her bilateral ears as well as pain behind her eyes. Patient has a history of migraines that have presented similarly. Patient reports she has not been seen by a neurologist, however she was diagnosed by her OB/GYN who stated that her birth control, which the patient is no longer on, was causing her to have migraines. Patient denies any visual changes. Patient has been taking ibuprofen and Tylenol without relief. Patient states that she has had intermittent lightheadedness over the past few months. She was diagnosed with vertigo [facility] and given Antivert a few months ago. Patient states the Antivert does help her symptoms. She states her symptoms seem to be more present when it is cloudy out. Patient denies any chest pain, shortness of breath, nasal congestion, abdominal pain, nausea, vomiting, dysuria.  HPI  Past Medical History:  Diagnosis Date  . UTI (lower urinary tract infection)   . Yeast infection     There are no active problems to display for this patient.   Past Surgical History:  Procedure Laterality Date  . TONSILLECTOMY    . TONSILLECTOMY    . wisdonm teeth removed      OB History    No data available       Home Medications    Prior to Admission medications   Medication Sig Start Date End Date Taking? Authorizing Provider  diphenhydrAMINE (BENADRYL) 25 MG tablet Take 25 mg by mouth at bedtime as needed (seasonal allergies).     Historical Provider, MD  etonogestrel (NEXPLANON) 68 MG IMPL implant 68 mg by Subdermal route once.  Implanted November 2015    Historical Provider, MD  ibuprofen (ADVIL,MOTRIN) 200 MG tablet Take 800 mg by mouth every 8 (eight) hours as needed for headache, moderate pain or cramping.     Historical Provider, MD  meclizine (ANTIVERT) 25 MG tablet Take 25 mg by mouth 3 (three) times daily as needed for dizziness (vertigo).  01/28/16   Historical Provider, MD  phenazopyridine (PYRIDIUM) 100 MG tablet Take 2 tablets (200 mg total) by mouth 3 (three) times daily as needed (urination pain). 02/09/16 02/08/17  Antony Madura, PA-C    Family History History reviewed. No pertinent family history.  Social History Social History  Substance Use Topics  . Smoking status: Current Every Day Smoker    Packs/day: 0.50    Types: Cigarettes  . Smokeless tobacco: Never Used  . Alcohol use Yes     Comment: occasional     Allergies   Fish allergy and Latex   Review of Systems Review of Systems  Constitutional: Negative for chills and fever.  HENT: Positive for ear pain. Negative for facial swelling and sore throat.   Eyes: Positive for photophobia. Negative for visual disturbance.  Respiratory: Negative for shortness of breath.   Cardiovascular: Negative for chest pain.  Gastrointestinal: Negative for abdominal pain, nausea and vomiting.  Genitourinary: Negative for dysuria.  Musculoskeletal: Negative for back pain.  Skin: Negative for rash and wound.  Neurological: Positive for light-headedness and headaches.  Psychiatric/Behavioral: The  patient is not nervous/anxious.      Physical Exam Updated Vital Signs BP (!) 111/44   Pulse 67   Temp 98.4 F (36.9 C) (Oral)   Resp 18   Ht 5\' 9"  (1.753 m)   Wt (!) 145.2 kg   LMP 03/11/2016 (Within Days)   SpO2 100%   BMI 47.26 kg/m   Physical Exam  Constitutional: She appears well-developed and well-nourished. No distress.  HENT:  Head: Normocephalic and atraumatic.  Right Ear: Tympanic membrane normal.  Left Ear: Tympanic membrane normal.    Mouth/Throat: Oropharynx is clear and moist. No oropharyngeal exudate.  Eyes: Conjunctivae and EOM are normal. Pupils are equal, round, and reactive to light. Right eye exhibits no discharge. Left eye exhibits no discharge. No scleral icterus.  Neck: Normal range of motion. Neck supple. No thyromegaly present.  No meningismus  Cardiovascular: Normal rate, regular rhythm, normal heart sounds and intact distal pulses.  Exam reveals no gallop and no friction rub.   No murmur heard. Pulmonary/Chest: Effort normal and breath sounds normal. No stridor. No respiratory distress. She has no wheezes. She has no rales.  Abdominal: Soft. Bowel sounds are normal. She exhibits no distension. There is no tenderness. There is no rebound and no guarding.  Musculoskeletal: She exhibits no edema.  Lymphadenopathy:    She has no cervical adenopathy.  Neurological: She is alert. Coordination normal.  CN 3-12 intact; normal sensation throughout; 5/5 strength in all 4 extremities; equal bilateral grip strength; no ataxia on finger to nose  Skin: Skin is warm and dry. No rash noted. She is not diaphoretic. No pallor.  Psychiatric: She has a normal mood and affect.  Nursing note and vitals reviewed.    ED Treatments / Results  Labs (all labs ordered are listed, but only abnormal results are displayed) Labs Reviewed - No data to display  EKG  EKG Interpretation None       Radiology No results found.  Procedures Procedures (including critical care time)  Medications Ordered in ED Medications  sodium chloride 0.9 % bolus 1,000 mL (1,000 mLs Intravenous New Bag/Given 03/14/16 1332)  ketorolac (TORADOL) 30 MG/ML injection 30 mg (30 mg Intravenous Given 03/14/16 1331)  prochlorperazine (COMPAZINE) injection 5 mg (5 mg Intravenous Given 03/14/16 1331)  diphenhydrAMINE (BENADRYL) injection 25 mg (25 mg Intravenous Given 03/14/16 1332)     Initial Impression / Assessment and Plan / ED Course  I have  reviewed the triage vital signs and the nursing notes.  Pertinent labs & imaging results that were available during my care of the patient were reviewed by me and considered in my medical decision making (see chart for details).  Clinical Course    On reevaluation following fluid bolus, Toradol, Compazine, Benadryl patient's headache is completely resolved. Presentation is like pts typical HA and non concerning [facility], ICH, Meningitis, or temporal arteritis. Pt is afebrile with no focal neuro deficits, nuchal rigidity, or change in vision. TMs clear bilaterally without signs of infection. Pt is to follow up and establish care with PCP and neurology for further evaluation and to discuss prophylactic medication. Pt verbalizes understanding and is agreeable with plan to dc. Patient vitals stable throughout ED course and discharged in satisfactory condition. I discussed patient case with Dr. Criss AlvineGoldston who agrees with plan.   Final Clinical Impressions(s) / ED Diagnoses   Final diagnoses:  Acute nonintractable headache, unspecified headache type    New Prescriptions New Prescriptions   No medications on file  Emi Holes, PA-C 03/14/16 1514    Pricilla Loveless, MD 03/16/16 [phone]

## 2016-03-14 NOTE — ED Notes (Signed)
Declined W/C at D/C and was escorted to lobby by RN. 

## 2016-03-14 NOTE — Discharge Instructions (Signed)
Please follow-up with neurology and establish care with a primary care provider by calling the numbers outlined in your discharge paperwork. Please return to the emergency department if you develop any new or worsening symptoms.

## 2016-03-14 NOTE — ED Triage Notes (Signed)
Pt reports she has ear pain with migraines X1 week. Pt reports the pain is sharp and the migraine has caused her to have eye pain and blurred vision. Pt a&o X4.

## 2016-05-10 ENCOUNTER — Emergency Department (HOSPITAL_COMMUNITY)
Admission: EM | Admit: 2016-05-10 | Discharge: 2016-05-10 | Disposition: A | Discharge status: Home / Self Care | Payer: Medicaid Other | Attending: Emergency Medicine | Admitting: Emergency Medicine

## 2016-05-10 ENCOUNTER — Encounter (HOSPITAL_COMMUNITY): Payer: Self-pay

## 2016-05-10 DIAGNOSIS — J069 Acute upper respiratory infection, unspecified: Secondary | ICD-10-CM | POA: Insufficient documentation

## 2016-05-10 DIAGNOSIS — F17210 Nicotine dependence, cigarettes, uncomplicated: Secondary | ICD-10-CM | POA: Insufficient documentation

## 2016-05-10 DIAGNOSIS — Z91040 Latex allergy status: Secondary | ICD-10-CM | POA: Insufficient documentation

## 2016-05-10 DIAGNOSIS — H6501 Acute serous otitis media, right ear: Secondary | ICD-10-CM | POA: Insufficient documentation

## 2016-05-10 MED ORDER — AZITHROMYCIN 250 MG PO TABS: 250.0000 mg | ORAL_TABLET | Freq: Every day | ORAL | 0 refills | Status: DC

## 2016-05-10 MED ORDER — BENZONATATE 100 MG PO CAPS: 100.0000 mg | ORAL_CAPSULE | Freq: Three times a day (TID) | ORAL | 0 refills | Status: DC

## 2016-05-10 NOTE — ED Triage Notes (Signed)
Patient complains of congestion and bilateral ear pain x 3-4 days. Reports dry cough, NAD

## 2016-05-10 NOTE — ED Provider Notes (Signed)
[facility]-EMERGENCY DEPT Provider Note   CSN: [id number] Arrival date & time: 05/10/16  1553   By signing my name below, I, Nichole Carlson, attest that this documentation has been prepared under the direction and in the presence of [facility] Ade[facility] Barletta. Electronically Signed: Clarisse GougeXavier Carlson, Scribe. 05/10/16. 5:21 PM.   History   Chief Complaint Chief Complaint  Patient presents with  . Nasal Congestion  . Otalgia  The history is provided by the patient. No language interpreter was used.  Otalgia  This is a new problem. The current episode started more than 2 days ago. There is pain in the right ear. The problem occurs constantly. The problem has been gradually worsening. There has been no fever. The pain is moderate. Associated symptoms include headaches and cough. Pertinent negatives include no abdominal pain, no vomiting and no rash. Sore throat: irritation.   HPI Comments: Nichole Carlson is a 20 y.o. female who presents to the Emergency Department complaining of constant, gradual worsening ear ache. Pt reports associated headache, fever, cough and congestion. Pt denies chills and vomiting.   Past Medical History:  Diagnosis Date  . UTI (lower urinary tract infection)   . Yeast infection     There are no active problems to display for this patient.   Past Surgical History:  Procedure Laterality Date  . TONSILLECTOMY    . TONSILLECTOMY    . wisdonm teeth removed      OB History    No data available       Home Medications    Prior to Admission medications   Medication Sig Start Date End Date Taking? Authorizing Provider  azithromycin (ZITHROMAX) 250 MG tablet Take 1 tablet (250 mg total) by mouth daily. Take first 2 tablets together, then 1 every day until finished. 05/10/16   Deyon Chizek Orlene OchM Jakira Mcfadden, NP  benzonatate (TESSALON) 100 MG capsule Take 1 capsule (100 mg total) by mouth every 8 (eight) hours. 05/10/16   Daisean Brodhead Orlene OchM Hailynn Slovacek, NP  diphenhydrAMINE (BENADRYL) 25 MG tablet Take 25 mg by mouth  at bedtime as needed (seasonal allergies).     Historical Provider, MD  etonogestrel (NEXPLANON) 68 MG IMPL implant 68 mg by Subdermal route once. Implanted November 2015    Historical Provider, MD  ibuprofen (ADVIL,MOTRIN) 200 MG tablet Take 800 mg by mouth every 8 (eight) hours as needed for headache, moderate pain or cramping.     Historical Provider, MD  meclizine (ANTIVERT) 25 MG tablet Take 25 mg by mouth 3 (three) times daily as needed for dizziness (vertigo).  01/28/16   Historical Provider, MD  phenazopyridine (PYRIDIUM) 100 MG tablet Take 2 tablets (200 mg total) by mouth 3 (three) times daily as needed (urination pain). 02/09/16 02/08/17  Antony MaduraKelly Humes, PA-C    Family History No family history on file.  Social History Social History  Substance Use Topics  . Smoking status: Current Every Day Smoker    Packs/day: 0.50    Types: Cigarettes  . Smokeless tobacco: Never Used  . Alcohol use Yes     Comment: occasional     Allergies   Fish allergy and Latex   Review of Systems Review of Systems  Constitutional: Positive for fever. Negative for chills.  HENT: Positive for congestion and ear pain. Negative for trouble swallowing. Sore throat: irritation.   Eyes: Negative for visual disturbance.  Respiratory: Positive for cough. Negative for chest tightness and shortness of breath.   Cardiovascular: Negative for chest pain.  Gastrointestinal: Negative for abdominal pain and  vomiting.  Genitourinary: Negative for dysuria and frequency.  Musculoskeletal: Negative for back pain.  Skin: Negative for rash.  Neurological: Positive for headaches.  Psychiatric/Behavioral: Negative for confusion. The patient is not nervous/anxious.      Physical Exam Updated Vital Signs BP 121/71 (BP Location: Left Arm)   Pulse 88   Temp 98.1 F (36.7 C) (Oral)   Resp 16   SpO2 99%   Physical Exam  Constitutional: She is oriented to person, place, and time. She appears well-developed and  well-nourished. No distress.  HENT:  Head: Normocephalic and atraumatic.  Right Ear: No mastoid tenderness. Tympanic membrane is erythematous.  Left Ear: Tympanic membrane normal. No mastoid tenderness.  Nose: Rhinorrhea present.  Mouth/Throat: Uvula is midline, oropharynx is clear and moist and mucous membranes are normal.  Eyes: Conjunctivae and EOM are normal.  Neck: Normal range of motion. Neck supple.  Cardiovascular: Normal rate and regular rhythm.   Pulmonary/Chest: Effort normal. No respiratory distress. She has no wheezes. She has no rales. She exhibits no tenderness.  Abdominal: Soft. Bowel sounds are normal. There is no tenderness.  Musculoskeletal: Normal range of motion. She exhibits no edema.  Lymphadenopathy:    She has cervical adenopathy (right).  Neurological: She is alert and oriented to person, place, and time.  Skin: Skin is warm and dry.  Psychiatric: She has a normal mood and affect. Judgment normal.  Nursing note and vitals reviewed.    ED Treatments / Results  DIAGNOSTIC STUDIES: Oxygen Saturation is 99% on RA, normal by my interpretation.    COORDINATION OF CARE: 5:21 PM Discussed treatment plan with pt at bedside and pt agreed to plan.   Labs (all labs ordered are listed, but only abnormal results are displayed) Labs Reviewed - No data to display   Radiology No results found.  Procedures Procedures (including critical care time)  Medications Ordered in ED Medications - No data to display   Initial Impression / Assessment and Plan / ED Course  I have reviewed the triage vital signs and the nursing notes.   Clinical Course    20 y.o. female with ear ache, cough and congestin stable for d/c without fever, mastoid tenderness and does not appear toxic. Will treat with Z-pak and tessalon. She will use cool mist vaporizer. Discussed with the patient and all questioned fully answered. She will return if any problems arise.   Final Clinical  Impressions(s) / ED Diagnoses   Final diagnoses:  Right acute serous otitis media, recurrence not specified  URI, acute    New Prescriptions New Prescriptions   AZITHROMYCIN (ZITHROMAX) 250 MG TABLET    Take 1 tablet (250 mg total) by mouth daily. Take first 2 tablets together, then 1 every day until finished.   BENZONATATE (TESSALON) 100 MG CAPSULE    Take 1 capsule (100 mg total) by mouth every 8 (eight) hours.  I personally performed the services described in this documentation, which was scribed in my presence. The recorded information has been reviewed and is accurate.    Janne NapoleonHope M Krupa Stege, NP 05/10/16 1727    Melene Planan Floyd, DO 05/10/16 1805

## 2016-05-29 ENCOUNTER — Emergency Department
Admission: EM | Admit: 2016-05-29 | Discharge: 2016-05-29 | Disposition: A | Discharge status: Home / Self Care | Payer: Medicaid Other | Attending: Emergency Medicine | Admitting: Emergency Medicine

## 2016-05-29 ENCOUNTER — Encounter: Payer: Self-pay | Admitting: Emergency Medicine

## 2016-05-29 DIAGNOSIS — F17210 Nicotine dependence, cigarettes, uncomplicated: Secondary | ICD-10-CM | POA: Insufficient documentation

## 2016-05-29 DIAGNOSIS — G43701 Chronic migraine without aura, not intractable, with status migrainosus: Secondary | ICD-10-CM | POA: Insufficient documentation

## 2016-05-29 DIAGNOSIS — Z79899 Other long term (current) drug therapy: Secondary | ICD-10-CM | POA: Insufficient documentation

## 2016-05-29 DIAGNOSIS — Z791 Long term (current) use of non-steroidal anti-inflammatories (NSAID): Secondary | ICD-10-CM | POA: Insufficient documentation

## 2016-05-29 MED ORDER — NAPROXEN 500 MG PO TBEC: 500.0000 mg | DELAYED_RELEASE_TABLET | Freq: Two times a day (BID) | ORAL | 0 refills | Status: DC

## 2016-05-29 MED ORDER — KETOROLAC TROMETHAMINE 60 MG/2ML IM SOLN
60.0000 mg | Freq: Once | INTRAMUSCULAR | Status: AC
  Administered 2016-05-29: 60 mg via INTRAMUSCULAR
  Filled 2016-05-29: qty 2

## 2016-05-29 NOTE — ED Triage Notes (Signed)
Patient presents to the ED with complaint of a migraine.  Patient reports history of migraines.  Patient states this feels similar to past migraines but they do not usually last all day.  Patient reports nausea but denies vomiting.  Patient is in no obvious distress at this time.  Ambulatory to triage and skin is warm and dry.

## 2016-05-30 NOTE — ED Provider Notes (Signed)
[facility] Emergency Department Provider Note  ____________________________________________   First MD Initiated Contact with Patient 05/29/16 1904     (approximate)  I have reviewed the triage vital signs and the nursing notes.   HISTORY  Chief Complaint Migraine    HPI Nichole Carlson is a 20 y.o. female presenting with a migraine for the past 3 days. Patient has a history of chronic migraines but has not sought care with a primary care provider to establish a migraine prophylactic regimen. Patient describes migraine as frontal in nature without aura. Patient has photophobia and phonophobia. She denies nausea and vomiting. Patient states this is not the worst headache of her life. 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 She states that she would "feel better if she had a few days of rest from work". She has not attempted alleviating measures. Patient currently rates pain at 6 out of 10 in intensity. Patient states this migraine has a very typical pattern in relation to her past migraines.   Past Medical History:  Diagnosis Date  . UTI (lower urinary tract infection)   . Yeast infection     There are no active problems to display for this patient.   Past Surgical History:  Procedure Laterality Date  . TONSILLECTOMY    . TONSILLECTOMY    . wisdonm teeth removed      Prior to Admission medications   Medication Sig Start Date End Date Taking? Authorizing Provider  azithromycin (ZITHROMAX) 250 MG tablet Take 1 tablet (250 mg total) by mouth daily. Take first 2 tablets together, then 1 every day until finished. 05/10/16   Hope Orlene OchM Neese, NP  benzonatate (TESSALON) 100 MG capsule Take 1 capsule (100 mg total) by mouth every 8 (eight) hours. 05/10/16   Hope Orlene OchM Neese, NP  diphenhydrAMINE (BENADRYL) 25 MG tablet Take 25 mg by mouth at bedtime as needed (seasonal allergies).     Historical Provider, MD  etonogestrel (NEXPLANON) 68 MG IMPL implant 68 mg by Subdermal  route once. Implanted November 2015    Historical Provider, MD  ibuprofen (ADVIL,MOTRIN) 200 MG tablet Take 800 mg by mouth every 8 (eight) hours as needed for headache, moderate pain or cramping.     Historical Provider, MD  meclizine (ANTIVERT) 25 MG tablet Take 25 mg by mouth 3 (three) times daily as needed for dizziness (vertigo).  01/28/16   Historical Provider, MD  naproxen (EC NAPROSYN) 500 MG EC tablet Take 1 tablet (500 mg total) by mouth 2 (two) times daily with a meal. 05/29/16 05/29/17  Orvil FeilJaclyn M Jerric Oyen, PA-C  phenazopyridine (PYRIDIUM) 100 MG tablet Take 2 tablets (200 mg total) by mouth 3 (three) times daily as needed (urination pain). 02/09/16 02/08/17  Antony MaduraKelly Humes, PA-C    Allergies Fish allergy and Latex  No family history on file.  Social History Social History  Substance Use Topics  . Smoking status: Current Every Day Smoker    Packs/day: 0.50    Types: Cigarettes  . Smokeless tobacco: Never Used  . Alcohol use Yes     Comment: occasional    Review of Systems Constitutional: No fever/chills Eyes: She has photophobia. ENT: No sore throat. Cardiovascular: Denies chest pain. Respiratory: Denies shortness of breath. Gastrointestinal: No abdominal pain.  No nausea, no vomiting.  No diarrhea.  No constipation. Genitourinary: Negative for dysuria. Musculoskeletal: Negative for back pain. Skin: Negative for rash. Neurological: Has headache.  10-point ROS otherwise negative.  ____________________________________________   PHYSICAL EXAM:  VITAL SIGNS:  ED Triage Vitals  Enc Vitals Group     BP 05/29/16 1834 123/63     Pulse Rate 05/29/16 1834 80     Resp 05/29/16 1834 18     Temp 05/29/16 1834 98.5 F (36.9 C)     Temp Source 05/29/16 1834 Oral     SpO2 05/29/16 1834 97 %     Weight 05/29/16 1834 (!) 325 lb (147.4 kg)     Height 05/29/16 1834 5\' 8"  (1.727 m)     Head Circumference --      Peak Flow --      Pain Score 05/29/16 2000 6     Pain Loc --      Pain  Edu? --      Excl. in GC? --     Constitutional: Alert and oriented. Lying supine in bed. Eyes: Conjunctivae are normal. PERRL. EOMI. Accomodation within normal limits. Head: Atraumatic. No tenderness to the maxillary or frontal sinuses.  Nose: No congestion/rhinnorhea. Nasal septum is midline. Mouth/Throat: Mucous membranes are moist.  Oropharynx non-erythematous. Neck: Full range of motion. Hematological/Lymphatic/Immunilogical: No cervical lymphadenopathy. Cardiovascular: Normal rate, regular rhythm. Grossly normal heart sounds.  Good peripheral circulation. Respiratory: Normal respiratory effort.  No retractions. Lungs CTAB. Gastrointestinal: Soft and nontender. No distention. No abdominal bruits. No CVA tenderness. Neurologic:  Normal speech and language. No gross focal neurologic deficits are appreciated. No gait instability. Skin:  Skin is warm, dry and intact. No rash noted. Psychiatric: Mood and affect are normal. Speech and behavior are normal.  ____________________________________________   LABS (all labs ordered are listed, but only abnormal results are displayed)  Labs Reviewed - No data to display   Procedures  Toradol     ____________________________________________   INITIAL IMPRESSION / ASSESSMENT AND PLAN / ED COURSE  Pertinent labs & imaging results that were available during my care of the patient were reviewed by me and considered in my medical decision making (see chart for details).    Clinical Course    Assessment and plan: Patient states that migraine headache improved to 2 out of 10 in intensity with Toradol injection. Patient was discharged with prescription for naproxen. Patient education was provided regarding establishing care with a primary care provider to discuss migraine prophylaxis and or abortive measures. Patient's vital signs are reassuring at this time. All patient questions were answered. Patient was checked on 3 times during this  emergency Department encounter.  ____________________________________________   FINAL CLINICAL IMPRESSION(S) / ED DIAGNOSES  Final diagnoses:  Chronic migraine without aura with status migrainosus, not intractable      NEW MEDICATIONS STARTED DURING THIS VISIT:  Discharge Medication List as of 05/29/2016  7:54 PM       Note:  This document was prepared using Dragon voice recognition software and may include unintentional dictation errors.    Orvil FeilJaclyn M Bryauna Byrum, PA-C 05/30/16 [id number]    Phineas SemenGraydon Goodman, MD 05/31/16 [id number]

## 2016-09-03 ENCOUNTER — Emergency Department (HOSPITAL_COMMUNITY)
Admission: EM | Admit: 2016-09-03 | Discharge: 2016-09-03 | Disposition: A | Discharge status: Home / Self Care | Payer: Self-pay | Attending: Emergency Medicine | Admitting: Emergency Medicine

## 2016-09-03 ENCOUNTER — Encounter (HOSPITAL_COMMUNITY): Payer: Self-pay | Admitting: Emergency Medicine

## 2016-09-03 ENCOUNTER — Emergency Department (HOSPITAL_COMMUNITY): Payer: Self-pay

## 2016-09-03 DIAGNOSIS — R1013 Epigastric pain: Secondary | ICD-10-CM | POA: Insufficient documentation

## 2016-09-03 DIAGNOSIS — R109 Unspecified abdominal pain: Secondary | ICD-10-CM

## 2016-09-03 DIAGNOSIS — Z91040 Latex allergy status: Secondary | ICD-10-CM | POA: Insufficient documentation

## 2016-09-03 DIAGNOSIS — F17210 Nicotine dependence, cigarettes, uncomplicated: Secondary | ICD-10-CM | POA: Insufficient documentation

## 2016-09-03 DIAGNOSIS — Z79899 Other long term (current) drug therapy: Secondary | ICD-10-CM | POA: Insufficient documentation

## 2016-09-03 LAB — COMPREHENSIVE METABOLIC PANEL
ALT: 15 U/L (ref 14–54)
AST: 15 U/L (ref 15–41)
Albumin: 3.7 g/dL (ref 3.5–5.0)
Alkaline Phosphatase: 79 U/L (ref 38–126)
Anion gap: 11 (ref 5–15)
BILIRUBIN TOTAL: 0.3 mg/dL (ref 0.3–1.2)
BUN: 12 mg/dL (ref 6–20)
CO2: 22 mmol/L (ref 22–32)
Calcium: 8.9 mg/dL (ref 8.9–10.3)
Chloride: 105 mmol/L (ref 101–111)
Creatinine, Ser: 0.63 mg/dL (ref 0.44–1.00)
GFR calc Af Amer: >60 mL/min (ref >60)
GFR calc non Af Amer: >60 mL/min (ref >60)
Glucose, Bld: 108 mg/dL — ABNORMAL HIGH (ref 65–99)
POTASSIUM: 3.5 mmol/L (ref 3.5–5.1)
Sodium: 138 mmol/L (ref 135–145)
TOTAL PROTEIN: 6.6 g/dL (ref 6.5–8.1)

## 2016-09-03 LAB — URINALYSIS, ROUTINE W REFLEX MICROSCOPIC
BACTERIA UA: NONE SEEN
Bilirubin Urine: NEGATIVE
GLUCOSE, UA: NEGATIVE mg/dL
HGB URINE DIPSTICK: NEGATIVE
Ketones, ur: NEGATIVE mg/dL
LEUKOCYTES UA: NEGATIVE
NITRITE: NEGATIVE
Protein, ur: 30 mg/dL — AB
SPECIFIC GRAVITY, URINE: 1.028 (ref 1.005–1.030)
pH: 5 (ref 5.0–8.0)

## 2016-09-03 LAB — CBC
HEMATOCRIT: 37.8 % (ref 36.0–46.0)
HEMOGLOBIN: 11.9 g/dL — AB (ref 12.0–15.0)
MCH: 26 pg (ref 26.0–34.0)
MCHC: 31.5 g/dL (ref 30.0–36.0)
MCV: 82.7 fL (ref 78.0–100.0)
Platelets: 218 10*3/uL (ref 150–400)
RBC: 4.57 MIL/uL (ref 3.87–5.11)
RDW: 15.4 % (ref 11.5–15.5)
WBC: 11.3 10*3/uL — AB (ref 4.0–10.5)

## 2016-09-03 LAB — PREGNANCY, URINE: Preg Test, Ur: NEGATIVE

## 2016-09-03 LAB — LIPASE, BLOOD: Lipase: 17 U/L (ref 11–51)

## 2016-09-03 MED ORDER — FAMOTIDINE 20 MG PO TABS: 20.0000 mg | ORAL_TABLET | Freq: Two times a day (BID) | ORAL | 0 refills | Status: DC

## 2016-09-03 MED ORDER — GI COCKTAIL ~~LOC~~
30.0000 mL | Freq: Once | ORAL | Status: AC
  Administered 2016-09-03: 30 mL via ORAL
  Filled 2016-09-03: qty 30

## 2016-09-03 MED ORDER — ONDANSETRON 4 MG PO TBDP
ORAL_TABLET | ORAL | Status: AC
  Filled 2016-09-03: qty 1

## 2016-09-03 MED ORDER — SUCRALFATE 1 G PO TABS: 1.0000 g | ORAL_TABLET | Freq: Three times a day (TID) | ORAL | 0 refills | Status: DC

## 2016-09-03 MED ORDER — ONDANSETRON 4 MG PO TBDP
4.0000 mg | ORAL_TABLET | Freq: Once | ORAL | Status: AC | PRN
  Administered 2016-09-03: 4 mg via ORAL

## 2016-09-03 MED ORDER — MAGNESIUM CITRATE PO SOLN
1.0000 | Freq: Once | ORAL | Status: AC
  Administered 2016-09-03: 1 via ORAL
  Filled 2016-09-03: qty 296

## 2016-09-03 MED ORDER — ONDANSETRON 4 MG PO TBDP
4.0000 mg | ORAL_TABLET | Freq: Once | ORAL | Status: AC
  Administered 2016-09-03: 4 mg via ORAL
  Filled 2016-09-03: qty 1

## 2016-09-03 NOTE — ED Notes (Signed)
Patient transported to X-ray 

## 2016-09-03 NOTE — ED Triage Notes (Addendum)
Pt to ED from home c/o abd pain with nausea. Last BM 2 days ago, states she usually goes everyday. Denies vomiting/diarrhea/fevers/urinary symptoms.

## 2016-09-03 NOTE — Discharge Instructions (Signed)
Take pepcid and carafate as prescribed for your pain. Avoid spicy, tomato based foods. Avoid caffeine, alcohol, NSAID medications. Follow up with family doctor. Take magnesium citrate when get home to help you have a bowel movement.

## 2016-09-03 NOTE — ED Provider Notes (Signed)
[facility]-EMERGENCY DEPT Provider Note   CSN: [id number] Arrival date & time: 09/03/16  0047     History   Chief Complaint Chief Complaint  Patient presents with  . Abdominal Pain    HPI Nichole Carlson is a 21 y.o. female.  HPI Nichole Carlson is a 21 y.o. female with history of urinary tract infections presents to emergency department complaining of abdominal pain in epigastric area, and possible constipation. Patient states her symptoms started 2 days ago. Pain is only in epigastric area, does not radiate. She states her last bowel movement was 2 days ago and states that is unusual for her. Patient states she has daily bowel movements. She did not take any medications for her pain or her constipation. She denies any urinary symptoms. Denies any vaginal discharge or bleeding. She reports associated nausea, no vomiting. States she was able to eat today that did not make her pain worse. No history of similar pain in the past however she does state that she has acid reflux.  Past Medical History:  Diagnosis Date  . UTI (lower urinary tract infection)   . Yeast infection     There are no active problems to display for this patient.   Past Surgical History:  Procedure Laterality Date  . TONSILLECTOMY    . TONSILLECTOMY    . wisdonm teeth removed      OB History    No data available       Home Medications    Prior to Admission medications   Medication Sig Start Date End Date Taking? Authorizing Provider  etonogestrel (NEXPLANON) 68 MG IMPL implant 68 mg by Subdermal route once. Implanted November 2015   Yes Historical Provider, MD  famotidine (PEPCID) 20 MG tablet Take 1 tablet (20 mg total) by mouth 2 (two) times daily. 09/03/16   Lonnel Gjerde, PA-C  sucralfate (CARAFATE) 1 g tablet Take 1 tablet (1 g total) by mouth 4 (four) times daily -  with meals and at bedtime. 09/03/16   Jaynie Crumble, PA-C    Family History No family history on file.  Social History Social  History  Substance Use Topics  . Smoking status: Current Every Day Smoker    Packs/day: 0.50    Types: Cigarettes  . Smokeless tobacco: Never Used  . Alcohol use Yes     Comment: occasional     Allergies   Fish allergy and Latex   Review of Systems Review of Systems  Constitutional: Negative for chills and fever.  Respiratory: Negative for cough, chest tightness and shortness of breath.   Cardiovascular: Negative for chest pain, palpitations and leg swelling.  Gastrointestinal: Positive for abdominal pain, constipation and nausea. Negative for diarrhea and vomiting.  Genitourinary: Negative for dysuria, flank pain and pelvic pain.  Musculoskeletal: Negative for arthralgias, myalgias, neck pain and neck stiffness.  Skin: Negative for rash.  Neurological: Negative for dizziness, weakness and headaches.  All other systems reviewed and are negative.    Physical Exam Updated Vital Signs BP 151/72 (BP Location: Left Arm)   Pulse (!) 57   Temp 97.8 F (36.6 C) (Oral)   Resp 20   LMP 08/27/2016 (Exact Date)   SpO2 100%   Physical Exam  Constitutional: She appears well-developed and well-nourished. No distress.  HENT:  Head: Normocephalic.  Eyes: Conjunctivae are normal.  Neck: Neck supple.  Cardiovascular: Normal rate, regular rhythm and normal heart sounds.   Pulmonary/Chest: Effort normal and breath sounds normal. No respiratory distress. She has no  wheezes. She has no rales.  Abdominal: Soft. Bowel sounds are normal. She exhibits no distension. There is tenderness. There is no rebound.  Epigastric tenderness  Musculoskeletal: She exhibits no edema.  Neurological: She is alert.  Skin: Skin is warm and dry.  Psychiatric: She has a normal mood and affect. Her behavior is normal.  Nursing note and vitals reviewed.    ED Treatments / Results  Labs (all labs ordered are listed, but only abnormal results are displayed) Labs Reviewed  COMPREHENSIVE METABOLIC PANEL -  Abnormal; Notable for the following:       Result Value   Glucose, Bld 108 (*)    All other components within normal limits  CBC - Abnormal; Notable for the following:    WBC 11.3 (*)    Hemoglobin 11.9 (*)    All other components within normal limits  URINALYSIS, ROUTINE W REFLEX MICROSCOPIC - Abnormal; Notable for the following:    APPearance HAZY (*)    Protein, ur 30 (*)    Squamous Epithelial / LPF 0-5 (*)    All other components within normal limits  LIPASE, BLOOD  PREGNANCY, URINE    EKG  EKG Interpretation None       Radiology Dg Abd 2 Views  Result Date: 09/03/2016 CLINICAL DATA:  Mid abdominal pain since yesterday EXAM: ABDOMEN - 2 VIEW COMPARISON:  11/04/2015 FINDINGS: The bowel gas pattern is normal. There is no evidence of free air. No radio-opaque calculi or other significant radiographic abnormality is seen. IMPRESSION: Negative. Electronically Signed   By: Ellery Plunk M.D.   On: 09/03/2016 06:31    Procedures Procedures (including critical care time)  Medications Ordered in ED Medications  ondansetron (ZOFRAN-ODT) disintegrating tablet 4 mg (4 mg Oral Given 09/03/16 0056)  ondansetron (ZOFRAN-ODT) disintegrating tablet 4 mg (4 mg Oral Given 09/03/16 0538)  gi cocktail (Maalox,Lidocaine,Donnatal) (30 mLs Oral Given 09/03/16 0538)  magnesium citrate solution 1 Bottle (1 Bottle Oral Provided for home use 09/03/16 0719)     Initial Impression / Assessment and Plan / ED Course  I have reviewed the triage vital signs and the nursing notes.  Pertinent labs & imaging results that were available during my care of the patient were reviewed by me and considered in my medical decision making (see chart for details).   patient in emergency department with epigastric pain and constipation. Abdomen is soft, no peritoneal signs, tenderness only in epigastric area. We'll try GI cocktail, labs, acute abdomen.    Patient feels better after GI cocktail. X-ray unremarkable,  labs normal. Question gastritis. At this time patient does not have any right upper quadrant tenderness, although cholelithiasis considered, unlikely. This was discussed with patient. We will try her on Pepcid and Carafate, will also give magnesium citrate for constipation. Follow-up with family doctor. Return precautions discussed.    Vitals:   09/03/16 0054 09/03/16 0342 09/03/16 0713  BP: 121/67 (!) 124/43 151/72  Pulse: 90 88 (!) 57  Resp: 18 18 20   Temp: 97.5 F (36.4 C)  97.8 F (36.6 C)  TempSrc: Oral  Oral  SpO2: 97% 99% 100%     Final Clinical Impressions(s) / ED Diagnoses   Final diagnoses:  Abdominal pain  Epigastric pain    New Prescriptions Discharge Medication List as of 09/03/2016  7:14 AM    START taking these medications   Details  famotidine (PEPCID) 20 MG tablet Take 1 tablet (20 mg total) by mouth 2 (two) times daily., Starting Thu 09/03/2016,  Print    sucralfate (CARAFATE) 1 g tablet Take 1 tablet (1 g total) by mouth 4 (four) times daily -  with meals and at bedtime., Starting Thu 09/03/2016, Print         Jaynie Crumbleatyana Armaan Pond, PA-C 09/04/16 0006    Layla MawKristen N Ward, DO 09/04/16 [id number]

## 2016-09-03 NOTE — ED Notes (Signed)
Verified with main lab that blood sent....per processing tech blood is there in main lab just haven't had a chance to receive yet.

## 2016-11-27 ENCOUNTER — Encounter: Payer: Self-pay | Admitting: Emergency Medicine

## 2016-11-27 ENCOUNTER — Emergency Department
Admission: EM | Admit: 2016-11-27 | Discharge: 2016-11-27 | Disposition: A | Discharge status: Home / Self Care | Payer: Medicaid Other | Attending: Emergency Medicine | Admitting: Emergency Medicine

## 2016-11-27 ENCOUNTER — Emergency Department: Payer: Medicaid Other

## 2016-11-27 DIAGNOSIS — F17210 Nicotine dependence, cigarettes, uncomplicated: Secondary | ICD-10-CM | POA: Insufficient documentation

## 2016-11-27 DIAGNOSIS — R42 Dizziness and giddiness: Secondary | ICD-10-CM

## 2016-11-27 LAB — URINALYSIS, COMPLETE (UACMP) WITH MICROSCOPIC
BILIRUBIN URINE: NEGATIVE
Bacteria, UA: NONE SEEN
Glucose, UA: NEGATIVE mg/dL
Hgb urine dipstick: NEGATIVE
KETONES UR: 20 mg/dL — AB
Leukocytes, UA: NEGATIVE
Nitrite: NEGATIVE
PROTEIN: NEGATIVE mg/dL
Specific Gravity, Urine: 1.03 (ref 1.005–1.030)
pH: 6 (ref 5.0–8.0)

## 2016-11-27 LAB — COMPREHENSIVE METABOLIC PANEL
ALBUMIN: 3.8 g/dL (ref 3.5–5.0)
ALT: 15 U/L (ref 14–54)
ANION GAP: 6 (ref 5–15)
AST: 16 U/L (ref 15–41)
Alkaline Phosphatase: 82 U/L (ref 38–126)
BUN: 11 mg/dL (ref 6–20)
CHLORIDE: 106 mmol/L (ref 101–111)
CO2: 26 mmol/L (ref 22–32)
Calcium: 9 mg/dL (ref 8.9–10.3)
Creatinine, Ser: 0.54 mg/dL (ref 0.44–1.00)
GFR calc Af Amer: >60 mL/min (ref >60)
GLUCOSE: 88 mg/dL (ref 65–99)
POTASSIUM: 4 mmol/L (ref 3.5–5.1)
Sodium: 138 mmol/L (ref 135–145)
Total Bilirubin: 0.5 mg/dL (ref 0.3–1.2)
Total Protein: 7.1 g/dL (ref 6.5–8.1)

## 2016-11-27 LAB — CBC
HEMATOCRIT: 37.3 % (ref 35.0–47.0)
Hemoglobin: 12.1 g/dL (ref 12.0–16.0)
MCH: 26.8 pg (ref 26.0–34.0)
MCHC: 32.4 g/dL (ref 32.0–36.0)
MCV: 82.7 fL (ref 80.0–100.0)
Platelets: 191 10*3/uL (ref 150–440)
RBC: 4.52 MIL/uL (ref 3.80–5.20)
RDW: 15 % — AB (ref 11.5–14.5)
WBC: 12.5 10*3/uL — ABNORMAL HIGH (ref 3.6–11.0)

## 2016-11-27 LAB — POCT PREGNANCY, URINE: PREG TEST UR: NEGATIVE

## 2016-11-27 LAB — TROPONIN I: Troponin I: <0.03 ng/mL (ref <0.03)

## 2016-11-27 LAB — LIPASE, BLOOD: Lipase: 17 U/L (ref 11–51)

## 2016-11-27 NOTE — ED Triage Notes (Addendum)
Pt presents w/ c/o palpitations, chest tightness, and shortness of breath after an episode of nausea and what she characterized as vertigo. Pt states she was at work and works in a hot environment. Pt states the palpitations and chest tightness started after feeling nauseated and dizzy at work and persisted. Pt states she is still feeling as if her heart is beating rapidly and has chest tightness and shortness of breath. Pt is in no acute distress at this time, although she occasionally takes deep sighing breaths. Pt states hx of anxiety and socially her mother, with whom she lives, has been hospitalized x 1 month and remains there.

## 2016-11-27 NOTE — ED Provider Notes (Signed)
[facility] Emergency Department Provider Note   ____________________________________________    I have reviewed the triage vital signs and the nursing notes.    HISTORY  Chief Complaint Palpitations and Nausea     HPI Nichole Carlson is a 21 y.o. female who presents with complaints of dizziness while at work today. Patient reports approximately 3 PM she was on her feet and felt "stressed out" and started developing "vertigo" which she gets when she gets anxious. She took a break and went out to her car and became more anxious and reports her heart started racing. She denies shortness of breath. No chest pain. No recent travel. No calf pain. History of blood clots. She reports this is happened to her several times over the last year. She attributes it to anxiety. She reports she feels better now   Past Medical History:  Diagnosis Date  . UTI (lower urinary tract infection)   . Yeast infection     There are no active problems to display for this patient.   Past Surgical History:  Procedure Laterality Date  . TONSILLECTOMY    . TONSILLECTOMY    . wisdonm teeth removed      Prior to Admission medications   Medication Sig Start Date End Date Taking? Authorizing Provider  meclizine (ANTIVERT) 25 MG tablet Take 25 mg by mouth 3 (three) times daily as needed for dizziness.   Yes [provider]  etonogestrel (NEXPLANON) 68 MG IMPL implant 68 mg by Subdermal route once. Implanted November 2015    [provider]  famotidine (PEPCID) 20 MG tablet Take 1 tablet (20 mg total) by mouth 2 (two) times daily. 09/03/16   Kirichenko, Tatyana, PA-C  sucralfate (CARAFATE) 1 g tablet Take 1 tablet (1 g total) by mouth 4 (four) times daily -  with meals and at bedtime. 09/03/16   Kirichenko, Lemont Fillers, PA-C     Allergies Fish allergy and Latex  History reviewed. No pertinent family history.  Social History Social History  Substance Use Topics  .  Smoking status: Current Every Day Smoker    Packs/day: 0.50    Types: Cigarettes  . Smokeless tobacco: Never Used  . Alcohol use Yes     Comment: occasional    Review of Systems  Constitutional: No fever/chills Eyes: No visual changes.  ENT: No sore throat. Cardiovascular: Denies chest pain. Respiratory: Denies shortness of breath. Gastrointestinal: No abdominal pain.  No nausea, no vomiting.   Genitourinary: Negative for dysuria. Musculoskeletal: Negative for back pain. Skin: Negative for rash. Neurological: Negative for headaches or weakness   ____________________________________________   PHYSICAL EXAM:  VITAL SIGNS: ED Triage Vitals  Enc Vitals Group     BP 11/27/16 1849 126/64     Pulse Rate 11/27/16 1849 77     Resp 11/27/16 1849 16     Temp 11/27/16 1849 98 F (36.7 C)     Temp Source 11/27/16 1849 Oral     SpO2 11/27/16 1849 98 %     Weight 11/27/16 1910 (!) 142 kg (313 lb)     Height 11/27/16 1910 1.727 m (5\' 8" )     Head Circumference --      Peak Flow --      Pain Score 11/27/16 1906 6     Pain Loc --      Pain Edu? --      Excl. in GC? --     Constitutional: Alert and oriented. No acute distress. Pleasant  and interactive Eyes: Conjunctivae are normal.  Head: Atraumatic. Nose: No congestion/rhinnorhea. Mouth/Throat: Mucous membranes are moist.   Neck:  Painless ROM Cardiovascular: Normal rate, regular rhythm. Grossly normal heart sounds.  Good peripheral circulation. Respiratory: Normal respiratory effort.  No retractions. Lungs CTAB. Gastrointestinal: Soft and nontender. No distention.  No CVA tenderness. Genitourinary: deferred Musculoskeletal: No lower extremity tenderness nor edema.  Warm and well perfused Neurologic:  Normal speech and language. No gross focal neurologic deficits are appreciated.  Skin:  Skin is warm, dry and intact. No rash noted. Psychiatric: Mood and affect are normal. Speech and behavior are  normal.  ____________________________________________   LABS (all labs ordered are listed, but only abnormal results are displayed)  Labs Reviewed  CBC - Abnormal; Notable for the following:       Result Value   WBC 12.5 (*)    RDW 15.0 (*)    All other components within normal limits  URINALYSIS, COMPLETE (UACMP) WITH MICROSCOPIC - Abnormal; Notable for the following:    Color, Urine YELLOW (*)    APPearance CLEAR (*)    Ketones, ur 20 (*)    Squamous Epithelial / LPF 0-5 (*)    All other components within normal limits  TROPONIN I  LIPASE, BLOOD  COMPREHENSIVE METABOLIC PANEL  POC URINE PREG, ED  POCT PREGNANCY, URINE   ____________________________________________  EKG  ED ECG REPORT I, Latoya Diskin, RJene EveryBERT, the attending physician, personally viewed and interpreted this ECG.  Date: 11/27/2016  Rhythm: normal sinus rhythm QRS Axis: normal Intervals: normal ST/T Wave abnormalities: normal Conduction Disturbances: none Narrative Interpretation: unremarkable  ____________________________________________  RADIOLOGY  Chest x-ray normal ____________________________________________   PROCEDURES  Procedure(s) performed: No    Critical Care performed: No ____________________________________________   INITIAL IMPRESSION / ASSESSMENT AND PLAN / ED COURSE  Pertinent labs & imaging results that were available during my care of the patient were reviewed by me and considered in my medical decision making (see chart for details).  Patient well-appearing and in no acute distress. She is asymptomatic currently. Vital signs normal. EKG normal. Lab work normal. Chest x-ray normal. No evidence of arrhythmia. Do suspect component of anxiety. However we will have her follow-up with cardiology for further evaluation.    ____________________________________________   FINAL CLINICAL IMPRESSION(S) / ED DIAGNOSES  Final diagnoses:  Dizziness      NEW MEDICATIONS STARTED  DURING THIS VISIT:  Discharge Medication List as of 11/27/2016  9:34 PM       Note:  This document was prepared using Dragon voice recognition software and may include unintentional dictation errors.    Jene EveryKinner, Tascha Casares, MD 11/27/16 2244

## 2016-11-27 NOTE — ED Triage Notes (Signed)
C/O episode of vertigo that started today at around 1500.  States took meclizine, which helped symptoms somewhat, but then around 1700 began feeling palpitations and dizziness persists.  AAOx3.  Skin warm and dry.  No SOB/ DOE.  NAD

## 2016-11-30 ENCOUNTER — Telehealth: Payer: Self-pay

## 2016-11-30 NOTE — Telephone Encounter (Signed)
Spoke to patient about making an ED fu appt she was seen on 11/27/16 for Palpitations She said she was not needing a follow up at the moment  She would call us if she did need us Nothing further needed.

## 2017-01-27 ENCOUNTER — Emergency Department
Admission: EM | Admit: 2017-01-27 | Discharge: 2017-01-27 | Disposition: A | Discharge status: Home / Self Care | Payer: Self-pay | Attending: Emergency Medicine | Admitting: Emergency Medicine

## 2017-01-27 ENCOUNTER — Encounter: Payer: Self-pay | Admitting: Emergency Medicine

## 2017-01-27 DIAGNOSIS — F17210 Nicotine dependence, cigarettes, uncomplicated: Secondary | ICD-10-CM | POA: Insufficient documentation

## 2017-01-27 DIAGNOSIS — N898 Other specified noninflammatory disorders of vagina: Secondary | ICD-10-CM | POA: Insufficient documentation

## 2017-01-27 DIAGNOSIS — Z79899 Other long term (current) drug therapy: Secondary | ICD-10-CM | POA: Insufficient documentation

## 2017-01-27 DIAGNOSIS — Z91040 Latex allergy status: Secondary | ICD-10-CM | POA: Insufficient documentation

## 2017-01-27 DIAGNOSIS — R1030 Lower abdominal pain, unspecified: Secondary | ICD-10-CM | POA: Insufficient documentation

## 2017-01-27 DIAGNOSIS — Z87411 Personal history of vaginal dysplasia: Secondary | ICD-10-CM | POA: Insufficient documentation

## 2017-01-27 HISTORY — DX: Dizziness and giddiness: R42

## 2017-01-27 LAB — URINALYSIS, ROUTINE W REFLEX MICROSCOPIC
BILIRUBIN URINE: NEGATIVE
Glucose, UA: NEGATIVE mg/dL
Hgb urine dipstick: NEGATIVE
KETONES UR: NEGATIVE mg/dL
Leukocytes, UA: NEGATIVE
NITRITE: NEGATIVE
Protein, ur: NEGATIVE mg/dL
SPECIFIC GRAVITY, URINE: 1.024 (ref 1.005–1.030)
pH: 5 (ref 5.0–8.0)

## 2017-01-27 LAB — POCT PREGNANCY, URINE: PREG TEST UR: NEGATIVE

## 2017-01-27 MED ORDER — FLUCONAZOLE 50 MG PO TABS
150.0000 mg | ORAL_TABLET | Freq: Once | ORAL | Status: AC
  Administered 2017-01-27: 150 mg via ORAL

## 2017-01-27 MED ORDER — FLUCONAZOLE 100 MG PO TABS
ORAL_TABLET | ORAL | Status: AC
  Filled 2017-01-27: qty 1

## 2017-01-27 MED ORDER — FLUCONAZOLE 50 MG PO TABS
ORAL_TABLET | ORAL | Status: AC
  Filled 2017-01-27: qty 1

## 2017-01-27 NOTE — Discharge Instructions (Signed)
As we discussed please take over-the-counter Azo for the next 3 days. If her symptoms have not improved or resolved please follow-up with your doctor for recheck and reevaluation. Return to the emergency department for any significant abdominal pain, fever, or any other symptom personally concerning to yourself.

## 2017-01-27 NOTE — ED Provider Notes (Signed)
[facility] Emergency Department Provider Note  Time seen: 7:09 PM  I have reviewed the triage vital signs and the nursing notes.   HISTORY  Chief Complaint Dysuria    HPI Nichole FurryLauren Carlson is a 21 y.o. female who presents to the emergency department with lower abdominal discomfort which she describes as bladder spasms. According to the patient apparently 1 week ago she developed bladder spasms with dysuria and burning upon urination. Patient states she has been drinking lots of fluids including cranberry juice and took Azo. States the dysuria stopped approximately 4-5 days ago but she continues to have intermittent bladder spasms.States her urine is clear and she has no burning or dysuria. Patient states approximately 2 weeks ago she went to her doctor and had a pelvic performed and was positive for bacterial vaginitis. Patient states she has finished her course of antibiotics but also began with some vaginal itching. States she is very prone to yeast infections and believe she probably has a yeast infection. Denies any discharge. Denies any bleeding. Denies any abdominal pain nausea or vomiting or diarrhea.  Past Medical History:  Diagnosis Date  . UTI (lower urinary tract infection)   . Vertigo   . Yeast infection     There are no active problems to display for this patient.   Past Surgical History:  Procedure Laterality Date  . TONSILLECTOMY    . TONSILLECTOMY    . wisdonm teeth removed      Prior to Admission medications   Medication Sig Start Date End Date Taking? Authorizing Provider  etonogestrel (NEXPLANON) 68 MG IMPL implant 68 mg by Subdermal route once. Implanted November 2015    [provider]  famotidine (PEPCID) 20 MG tablet Take 1 tablet (20 mg total) by mouth 2 (two) times daily. 09/03/16   Kirichenko, Lemont Fillersatyana, PA-C  meclizine (ANTIVERT) 25 MG tablet Take 25 mg by mouth 3 (three) times daily as needed for dizziness.    [provider]  sucralfate (CARAFATE) 1 g tablet Take 1 tablet (1 g total) by mouth 4 (four) times daily -  with meals and at bedtime. 09/03/16   Kirichenko, Lemont Fillersatyana, PA-C    Allergies  Allergen Reactions  . Fish Allergy Anaphylaxis and Hives    Reaction to fish sticks   . Latex Itching    Vaginal itching    No family history on file.  Social History Social History  Substance Use Topics  . Smoking status: Current Every Day Smoker    Packs/day: 0.50    Types: Cigarettes  . Smokeless tobacco: Never Used  . Alcohol use Yes     Comment: occasional    Review of Systems Constitutional: Negative for fever. Cardiovascular: Negative for chest pain. Respiratory: Negative for shortness of breath. Gastrointestinal: States occasional lower abdominal spasm. Denies any currently. Negative for nausea vomiting or diarrhea Genitourinary: Negative for dysuria.Positive for vaginal itching Musculoskeletal: Negative for back pain. Skin: Negative for rash. Neurological: Negative for headache All other ROS negative  ____________________________________________   PHYSICAL EXAM:  VITAL SIGNS: ED Triage Vitals  Enc Vitals Group     BP 01/27/17 1749 (!) 143/73     Pulse Rate 01/27/17 1749 63     Resp 01/27/17 1749 20     Temp 01/27/17 1749 97.8 F (36.6 C)     Temp Source 01/27/17 1749 Oral     SpO2 01/27/17 1749 100 %     Weight 01/27/17 1746 (!) 312 lb (141.5 kg)  Height 01/27/17 1746 5\' 8"  (1.727 m)     Head Circumference --      Peak Flow --      Pain Score 01/27/17 1746 7     Pain Loc --      Pain Edu? --      Excl. in GC? --     Constitutional: Alert and oriented. Well appearing and in no distress. Eyes: Normal exam ENT   Head: Normocephalic and atraumatic.   Mouth/Throat: Mucous membranes are moist. Cardiovascular: Normal rate, regular rhythm. No murmur Respiratory: Normal respiratory effort without tachypnea nor retractions. Breath sounds are clear   Gastrointestinal: Soft, slight suprapubic tenderness otherwise benign abdominal exam. No rebound or guarding. No distention. Musculoskeletal: Nontender with normal range of motion in all extremities.  Neurologic:  Normal speech and language. No gross focal neurologic deficits  Skin:  Skin is warm, dry and intact.  Psychiatric: Mood and affect are normal.   ____________________________________________   INITIAL IMPRESSION / ASSESSMENT AND PLAN / ED COURSE  Pertinent labs & imaging results that were available during my care of the patient were reviewed by me and considered in my medical decision making (see chart for details).  Patient presents to the emergency department for intermittent "bladder spasms." Patient states significant dysuria proximal leg 1 week ago after drinking lots of fluids including cranberry juice and taking Azo she states that is gone and her urine is clear. Patient's urinalysis is normal. Patient states she just had a pelvic performed 2 weeks ago and does not wish for another one to be performed but states she is having itching since completing her course of antibiotics. We will cover with a one-time dose of Diflucan. I discussed with the patient that even though her urinary tract infection appears to have cleared she could be still experiencing bladder wall inflammation/spasm and to continue taking Azo for the next 2-3 days and following up with her doctor for recheck. Patient is agreeable to plan. Overall the patient appears very well with a normal physical exam besides very slight suprapubic tenderness. Patient will follow up with her doctor for recheck/reevaluation.  ____________________________________________   FINAL CLINICAL IMPRESSION(S) / ED DIAGNOSES  Lower abdominal pain  Vaginal itching     Minna AntisPaduchowski, Jermy Couper, MD 01/27/17 [phone]

## 2017-01-27 NOTE — ED Triage Notes (Signed)
States she was recently treated for UTI  Finished her meds  Now having spasm like pain in her bladder which is intermittent

## 2017-04-28 IMAGING — CT CT ABD-PELV W/ CM
2 of 4 series · 15 of 46 positions shown, 17 images · IV contrast (agent unspecified)
Comparison: None.

CLINICAL DATA: Watery dark diarrhea since this morning. Chills and
lightheadedness. Abdominal pain.

EXAM:
CT ABDOMEN AND PELVIS WITH CONTRAST
TECHNIQUE: Multidetector CT imaging of the abdomen and pelvis was performed
using the standard protocol following bolus administration of
intravenous contrast.
CONTRAST:  100 mL [id number]

[Series 3: a/p w/ 5mm · axial · 0.73mm/px · z∈[+1154,+1624]mm · 12 of 104 slices shown, 14 images]
[im 5/104  soft-tissue]
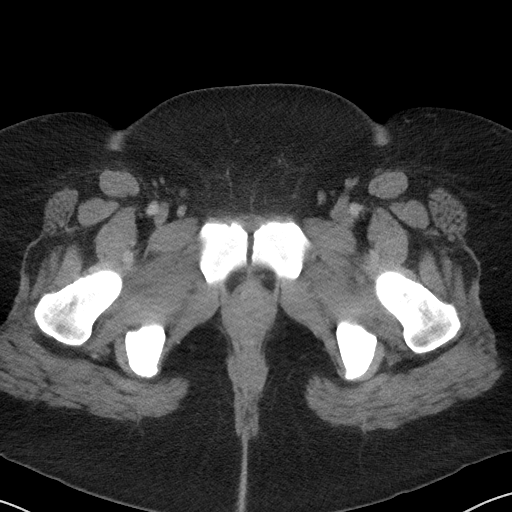
[im 5/104  bone]
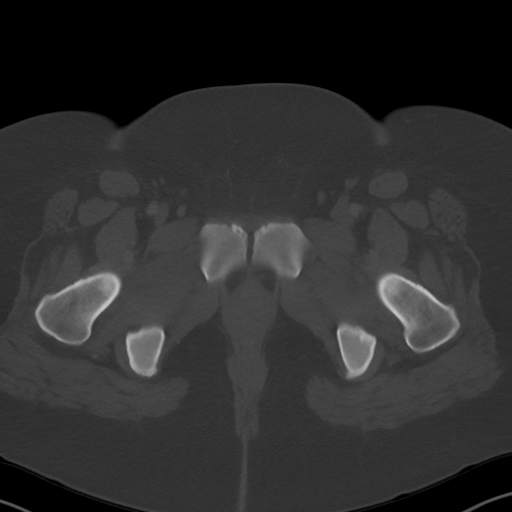
[im 15/104  soft-tissue]
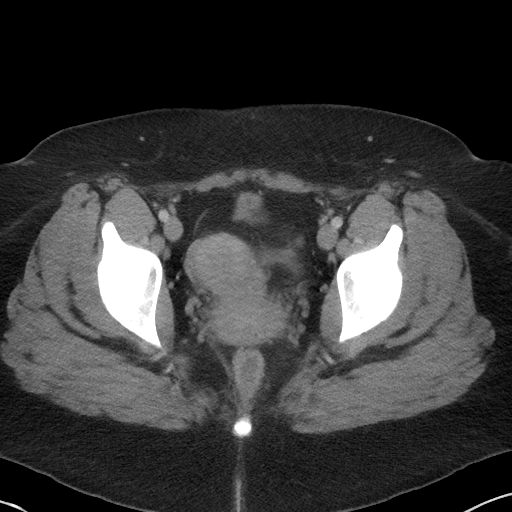
[im 24/104  soft-tissue]
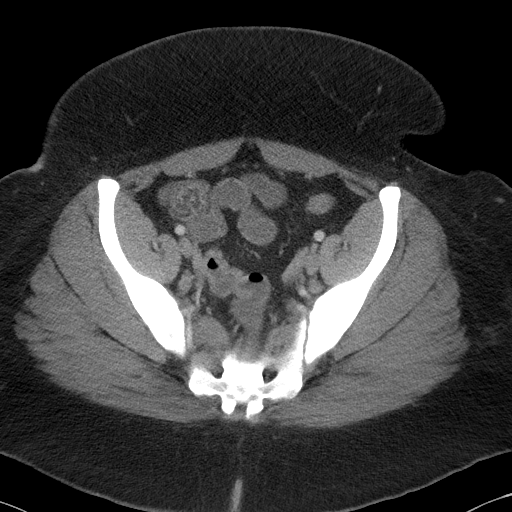
[im 33/104  soft-tissue]
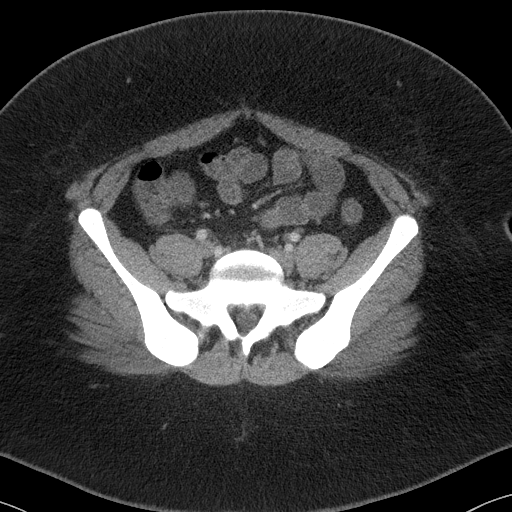
[im 38/104  soft-tissue]
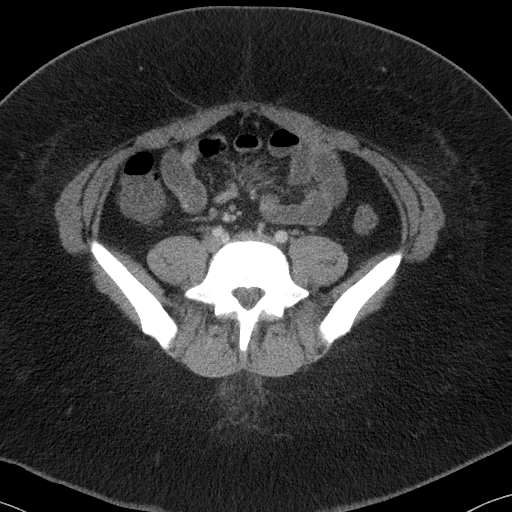
[im 47/104  soft-tissue]
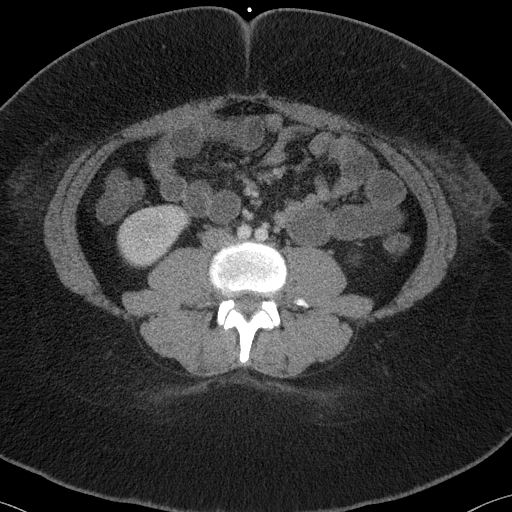
[im 57/104  soft-tissue]
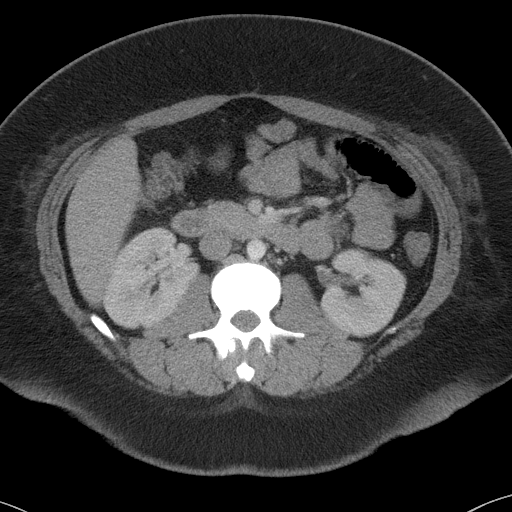
[im 66/104  soft-tissue]
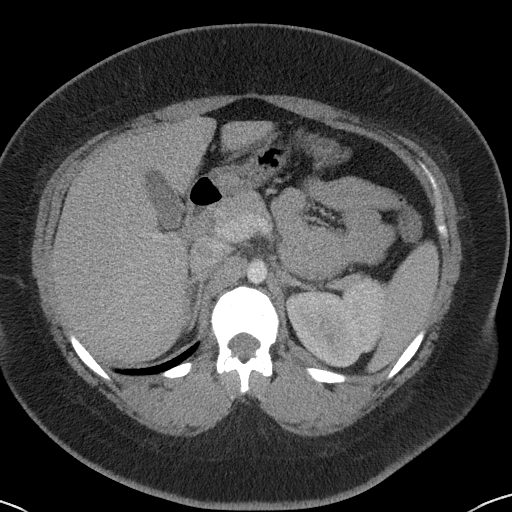
[im 71/104  soft-tissue]
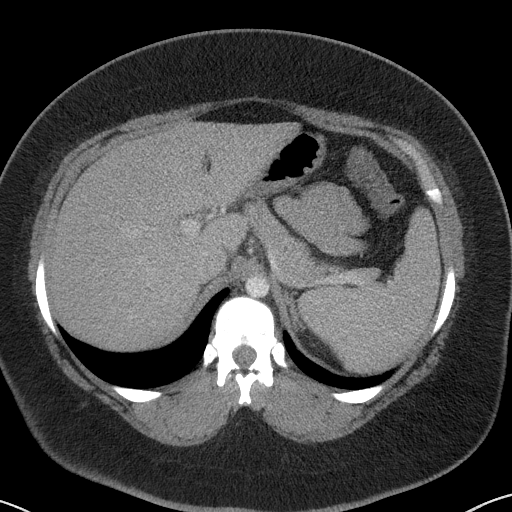
[im 71/104  bone]
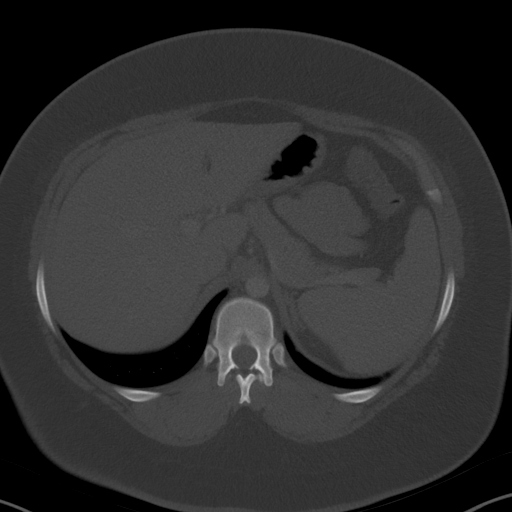
[im 80/104  soft-tissue]
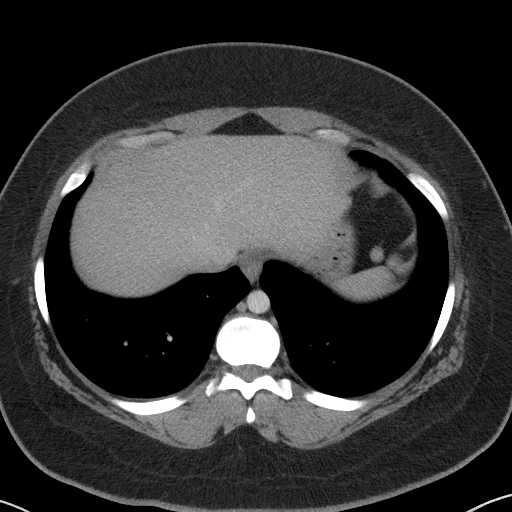
[im 89/104  soft-tissue]
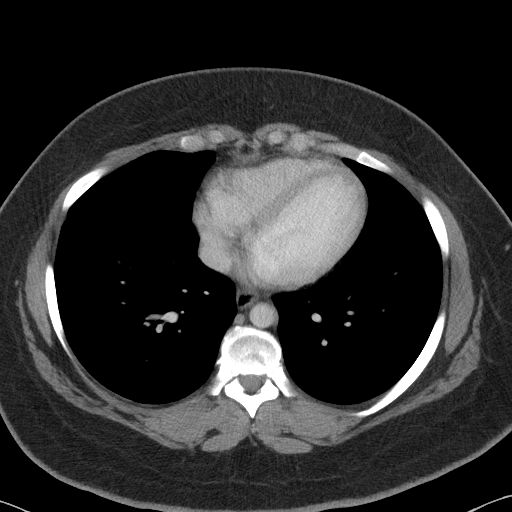
[im 99/104  soft-tissue]
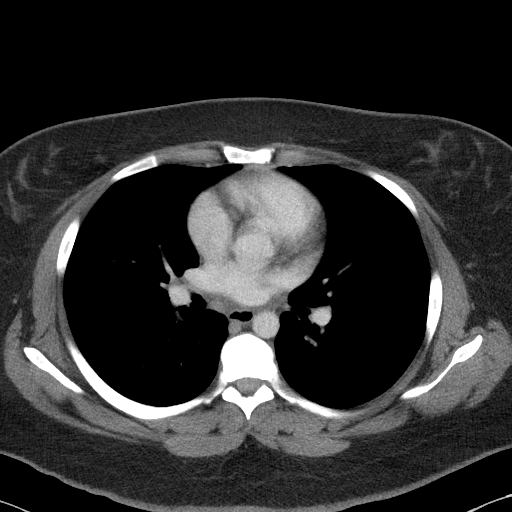

[Series 5: a/p w/ cor · coronal · 1.01mm/px · 3 of 179 slices shown]
[im 60/179  soft-tissue]
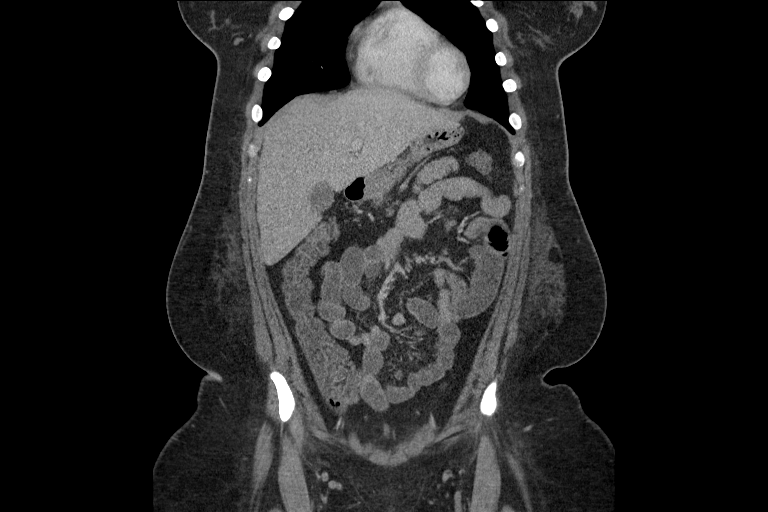
[im 80/179  soft-tissue]
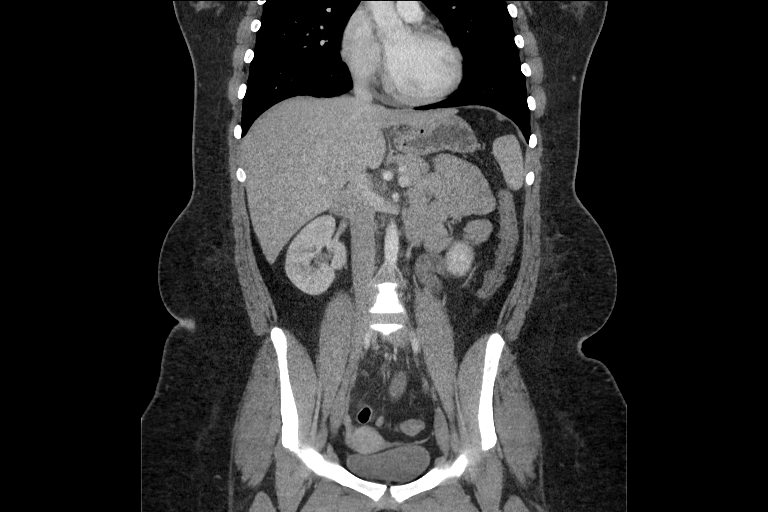
[im 99/179  soft-tissue]
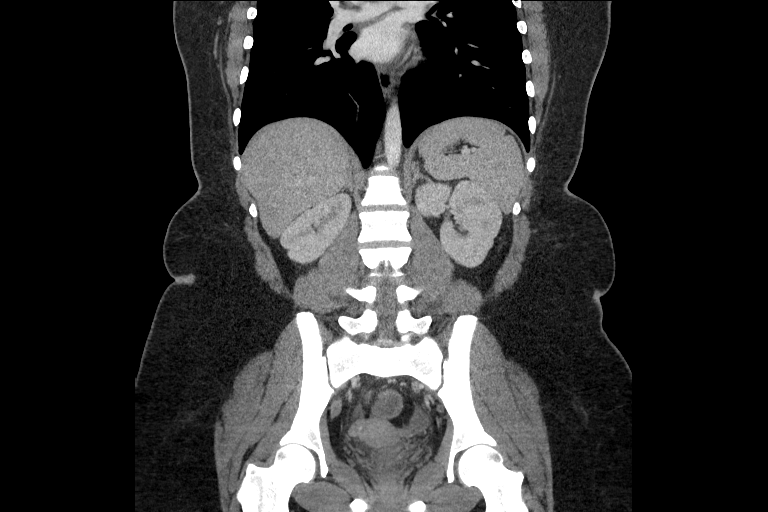

[15 of 46 positions shown; findings below may reference images not displayed]

FINDINGS: Lower chest: 2 mm punctate nodule at the left lung base on sequence
2, image 26 is likely an incidental finding for patient of this age.
Otherwise, the lung bases are clear.

Hepatobiliary: Normal appearance of the liver, gallbladder and
portal venous system.

Pancreas: Normal appearance of the pancreas without inflammation or
duct dilatation.

Spleen: Normal appearance of spleen without enlargement

Adrenals/Urinary Tract: Normal adrenal glands. There is a 4 mm stone
in the left kidney interpolar region without hydronephrosis. No
suspicious kidney findings. Normal appearance of the urinary
bladder.

Stomach/Bowel: Fluid-filled loops of small bowel without significant
dilatation. Normal appearance of stomach and colon. Evidence for a
small normal appearing appendix. No evidence for a bowel
obstruction. There is also fluid in the distal sigmoid colon and
rectum.

Vascular/Lymphatic: Normal caliber of the abdominal aorta. No
suspicious lymphadenopathy.

Reproductive: Normal appearance of the uterus and ovaries.

Other: No free fluid.  No free air.

Musculoskeletal: No acute abnormality.
IMPRESSION: Nonobstructive left kidney stone.

Fluid-filled bowel loops. No evidence for a bowel obstruction and no
bowel inflammation.

## 2017-07-05 ENCOUNTER — Other Ambulatory Visit: Payer: Self-pay

## 2017-07-05 ENCOUNTER — Encounter: Payer: Self-pay | Admitting: Emergency Medicine

## 2017-07-05 ENCOUNTER — Emergency Department
Admission: EM | Admit: 2017-07-05 | Discharge: 2017-07-05 | Disposition: A | Discharge status: Home / Self Care | Payer: 59 | Attending: Emergency Medicine | Admitting: Emergency Medicine

## 2017-07-05 DIAGNOSIS — R52 Pain, unspecified: Secondary | ICD-10-CM | POA: Diagnosis not present

## 2017-07-05 DIAGNOSIS — J029 Acute pharyngitis, unspecified: Secondary | ICD-10-CM | POA: Diagnosis not present

## 2017-07-05 DIAGNOSIS — R5383 Other fatigue: Secondary | ICD-10-CM | POA: Diagnosis present

## 2017-07-05 DIAGNOSIS — Z87891 Personal history of nicotine dependence: Secondary | ICD-10-CM | POA: Insufficient documentation

## 2017-07-05 DIAGNOSIS — B349 Viral infection, unspecified: Secondary | ICD-10-CM | POA: Diagnosis not present

## 2017-07-05 LAB — GROUP A STREP BY PCR: Group A Strep by PCR: NOT DETECTED

## 2017-07-05 MED ORDER — ACETAMINOPHEN 500 MG PO TABS
1000.0000 mg | ORAL_TABLET | Freq: Once | ORAL | Status: AC
  Administered 2017-07-05: 1000 mg via ORAL
  Filled 2017-07-05: qty 2

## 2017-07-05 MED ORDER — BENZONATATE 100 MG PO CAPS: 200.0000 mg | ORAL_CAPSULE | Freq: Three times a day (TID) | ORAL | 0 refills | Status: AC | PRN

## 2017-07-05 NOTE — ED Provider Notes (Signed)
[facility] Emergency Department Provider Note   ____________________________________________   First MD Initiated Contact with Patient 07/05/17 [phone]     (approximate)  I have reviewed the triage vital signs and the nursing notes.   HISTORY  Chief Complaint Sore Throat and Generalized Body Aches   HPI Nichole Carlson is a 22 y.o. female is here complaining of body aches and fatigue for the last 2 days. Patient states that she began with sore throat last night. She is unaware of fever but felt her skin was warm. She denies any nausea, vomiting or diarrhea. Patient is not taking any over-the-counter medication this morning. Rates her pain as 9/10.   Past Medical History:  Diagnosis Date  . UTI (lower urinary tract infection)   . Vertigo   . Yeast infection     There are no active problems to display for this patient.   Past Surgical History:  Procedure Laterality Date  . TONSILLECTOMY    . TONSILLECTOMY    . wisdonm teeth removed      Prior to Admission medications   Medication Sig Start Date End Date Taking? Authorizing Provider  benzonatate (TESSALON PERLES) 100 MG capsule Take 2 capsules (200 mg total) by mouth 3 (three) times daily as needed. 07/05/17 07/05/18  Tommi Rumps, PA-C  etonogestrel (NEXPLANON) 68 MG IMPL implant 68 mg by Subdermal route once. Implanted November 2015    [provider]  meclizine (ANTIVERT) 25 MG tablet Take 25 mg by mouth 3 (three) times daily as needed for dizziness.    [provider]    Allergies Fish allergy and Latex  No family history on file.  Social History Social History   Tobacco Use  . Smoking status: Former Smoker    Packs/day: 0.50    Types: Cigarettes  . Smokeless tobacco: Never Used  Substance Use Topics  . Alcohol use: Yes    Comment: occasional  . Drug use: Yes    Types: Marijuana    Review of Systems Constitutional: No fever/chills Eyes: No visual  changes. ENT: positive sore throat. Cardiovascular: Denies chest pain. Respiratory: Denies shortness of breath. Gastrointestinal: No abdominal pain.  No nausea, no vomiting. positive diarrhea.  Genitourinary: Negative for dysuria. Musculoskeletal: positive for body aches. Skin: Negative for rash. Neurological: Negative for headaches, focal weakness or numbness. ____________________________________________   PHYSICAL EXAM:  VITAL SIGNS: ED Triage Vitals  Enc Vitals Group     BP 07/05/17 0710 (!) 127/42     Pulse Rate 07/05/17 0710 82     Resp 07/05/17 0710 20     Temp 07/05/17 0710 99.5 F (37.5 C)     Temp Source 07/05/17 0710 Oral     SpO2 07/05/17 0710 99 %     Weight 07/05/17 0711 (!) 315 lb (142.9 kg)     Height 07/05/17 0711 5' 8.5" (1.74 m)     Head Circumference --      Peak Flow --      Pain Score 07/05/17 0710 9     Pain Loc --      Pain Edu? --      Excl. in GC? --    Constitutional: Alert and oriented. Well appearing and in no acute distress. Eyes: Conjunctivae are normal.  Nose: No congestion/rhinnorhea. Mouth/Throat: Mucous membranes are moist.  Oropharynx non-erythematous. Posterior drainage. Neck: No stridor.   Hematological/Lymphatic/Immunilogical: No cervical lymphadenopathy. Cardiovascular: Normal rate, regular rhythm. Grossly normal heart sounds.  Good peripheral circulation. Respiratory: Normal  respiratory effort.  No retractions. Lungs CTAB. Gastrointestinal: Soft and nontender. No distention. Bowel sounds normoactive 4 quadrants. Musculoskeletal: No lower extremity tenderness nor edema.  No joint effusions. Neurologic:  Normal speech and language. No gross focal neurologic deficits are appreciated. No gait instability. Skin:  Skin is warm, dry and intact.  Psychiatric: Mood and affect are normal. Speech and behavior are normal.  ____________________________________________   LABS (all labs ordered are listed, but only abnormal results are  displayed)  Labs Reviewed  GROUP A STREP BY PCR    PROCEDURES  Procedure(s) performed: None  Procedures  Critical Care performed: No  ____________________________________________   INITIAL IMPRESSION / ASSESSMENT AND PLAN / ED COURSE [facility] if any continued problems. She'll begin on clear liquids for the next 24 hours and advance her diet if there is no continued diarrhea. Tylenol or ibuprofen as needed for body aches for throat pain. Tessalon if needed for cough.  ____________________________________________   FINAL CLINICAL IMPRESSION(S) / ED DIAGNOSES  Final diagnoses:  Viral illness     ED Discharge Orders        Ordered    benzonatate (TESSALON PERLES) 100 MG capsule  3 times daily PRN     07/05/17 0820       Note:  This document was prepared using Dragon voice recognition software and may include unintentional dictation errors.    Tommi RumpsSummers, Kimisha Eunice L, PA-C 07/05/17 1129    Minna AntisPaduchowski, Kevin, MD 07/05/17 1410

## 2017-07-05 NOTE — ED Notes (Signed)
See triage note  States she developed some nausea  And dizziness on Thursday with some body aches. Woke up with sore throat yesterday with subjective fever  Low grade fever on arrival

## 2017-07-05 NOTE — Discharge Instructions (Addendum)
[facility] if any continued problems. Begin with clear liquids for the next 24 hours then advance diet to bananas, rice, applesauce and plain toast today if Diarrhea is under control.  Tylenol or ibuprofen as needed for body aches or throat pain. Increase fluids.  Tessalon if needed for cough.

## 2017-07-05 NOTE — ED Triage Notes (Signed)
Body aches and fatigue x 2 days. Sore throat began yesterday.

## 2017-10-17 ENCOUNTER — Emergency Department
Admission: EM | Admit: 2017-10-17 | Discharge: 2017-10-17 | Disposition: A | Discharge status: Home / Self Care | Payer: 59 | Attending: Emergency Medicine | Admitting: Emergency Medicine

## 2017-10-17 ENCOUNTER — Other Ambulatory Visit: Payer: Self-pay

## 2017-10-17 ENCOUNTER — Encounter: Payer: Self-pay | Admitting: Emergency Medicine

## 2017-10-17 DIAGNOSIS — R300 Dysuria: Secondary | ICD-10-CM | POA: Insufficient documentation

## 2017-10-17 DIAGNOSIS — Z79899 Other long term (current) drug therapy: Secondary | ICD-10-CM | POA: Insufficient documentation

## 2017-10-17 DIAGNOSIS — Z202 Contact with and (suspected) exposure to infections with a predominantly sexual mode of transmission: Secondary | ICD-10-CM | POA: Diagnosis present

## 2017-10-17 DIAGNOSIS — N760 Acute vaginitis: Secondary | ICD-10-CM | POA: Diagnosis not present

## 2017-10-17 DIAGNOSIS — Z87891 Personal history of nicotine dependence: Secondary | ICD-10-CM | POA: Diagnosis not present

## 2017-10-17 LAB — URINALYSIS, COMPLETE (UACMP) WITH MICROSCOPIC
Bilirubin Urine: NEGATIVE
Glucose, UA: NEGATIVE mg/dL
Hgb urine dipstick: NEGATIVE
KETONES UR: NEGATIVE mg/dL
Leukocytes, UA: NEGATIVE
Nitrite: NEGATIVE
PH: 6 (ref 5.0–8.0)
Protein, ur: NEGATIVE mg/dL
SPECIFIC GRAVITY, URINE: 1.029 (ref 1.005–1.030)

## 2017-10-17 LAB — WET PREP, GENITAL
Clue Cells Wet Prep HPF POC: NONE SEEN
Sperm: NONE SEEN
Trich, Wet Prep: NONE SEEN
YEAST WET PREP: NONE SEEN

## 2017-10-17 LAB — POCT PREGNANCY, URINE: Preg Test, Ur: NEGATIVE

## 2017-10-17 LAB — CHLAMYDIA/NGC RT PCR (ARMC ONLY)
Chlamydia Tr: NOT DETECTED
N GONORRHOEAE: NOT DETECTED

## 2017-10-17 MED ORDER — METRONIDAZOLE 500 MG PO TABS
2000.0000 mg | ORAL_TABLET | Freq: Once | ORAL | Status: AC
  Administered 2017-10-17: 2000 mg via ORAL
  Filled 2017-10-17: qty 4

## 2017-10-17 NOTE — Discharge Instructions (Signed)
Your gonorrhea and chlamydia results are pending

## 2017-10-17 NOTE — ED Notes (Signed)
Pt states she was recently treated for a STD but had intercourse with the same partner, states she informed them but is having the same sx as before. States she is having bladder like pain before and after urination.. Denies discharge at present.

## 2017-10-17 NOTE — ED Triage Notes (Signed)
Pt states 2 weeks ago she was diagnosed with trichomonasis and treated, states she had sex again with the same partner about a week ago. Pt states she is having similar sxs and thinks she got re-infected.   Pt states she is having dysuria and having some vaginal discharge.

## 2017-10-17 NOTE — ED Provider Notes (Signed)
[facility] Emergency Department Provider Note  ____________________________________________  Time seen: Approximately 7:12 AM  I have reviewed the triage vital signs and the nursing notes.   HISTORY  Chief Complaint Exposure to STD and Dysuria    HPI Nichole Carlson is a 22 y.o. female that presents the emergency department for evaluation of dysuria and urgency for 2 days and exposure to trichomonas 1 week ago.  Patient states that she tested positive for trichomonas 2 weeks ago and received treatment.  She made her partner aware that he needed to be treated as well and was told that he was treated.  She had unprotected intercourse with him about 1 week ago and began to have same symptoms of vaginal discharge again.  Last menstrual period was at the beginning of April.  No fever, chills, nausea, vomiting.   Past Medical History:  Diagnosis Date  . UTI (lower urinary tract infection)   . Vertigo   . Yeast infection     There are no active problems to display for this patient.   Past Surgical History:  Procedure Laterality Date  . TONSILLECTOMY    . TONSILLECTOMY    . wisdonm teeth removed      Prior to Admission medications   Medication Sig Start Date End Date Taking? Authorizing Provider  benzonatate (TESSALON PERLES) 100 MG capsule Take 2 capsules (200 mg total) by mouth 3 (three) times daily as needed. 07/05/17 07/05/18  Tommi RumpsSummers, Rhonda L, PA-C  etonogestrel (NEXPLANON) 68 MG IMPL implant 68 mg by Subdermal route once. Implanted November 2015    [provider]  meclizine (ANTIVERT) 25 MG tablet Take 25 mg by mouth 3 (three) times daily as needed for dizziness.    [provider]    Allergies Fish allergy and Latex  History reviewed. No pertinent family history.  Social History Social History   Tobacco Use  . Smoking status: Former Smoker    Packs/day: 0.50    Types: Cigarettes  . Smokeless tobacco: Never Used  Substance Use  Topics  . Alcohol use: Yes    Comment: occasional  . Drug use: Yes    Types: Marijuana     Review of Systems  Constitutional: No fever/chills Cardiovascular: No chest pain. Respiratory: No SOB. Gastrointestinal: No abdominal pain.  No nausea, no vomiting.  Musculoskeletal: Negative for musculoskeletal pain. Skin: Negative for rash, abrasions, lacerations, ecchymosis.   ____________________________________________   PHYSICAL EXAM:  VITAL SIGNS: ED Triage Vitals  Enc Vitals Group     BP 10/17/17 0701 107/69     Pulse Rate 10/17/17 0701 89     Resp 10/17/17 0701 18     Temp 10/17/17 0701 97.8 F (36.6 C)     Temp Source 10/17/17 0701 Oral     SpO2 10/17/17 0701 100 %     Weight 10/17/17 0703 (!) 318 lb (144.2 kg)     Height 10/17/17 0703 5\' 9"  (1.753 m)     Head Circumference --      Peak Flow --      Pain Score --      Pain Loc --      Pain Edu? --      Excl. in GC? --      Constitutional: Alert and oriented. Well appearing and in no acute distress. Eyes: Conjunctivae are normal. PERRL. EOMI. Head: Atraumatic. ENT:      Ears:      Nose: No congestion/rhinnorhea.      Mouth/Throat: Mucous  membranes are moist.  Neck: No stridor.   Cardiovascular: Normal rate, regular rhythm.  Good peripheral circulation. Respiratory: Normal respiratory effort without tachypnea or retractions. Lungs CTAB. Good air entry to the bases with no decreased or absent breath sounds. Gastrointestinal: Bowel sounds 4 quadrants. Soft and nontender to palpation. No guarding or rigidity. No palpable masses. No distention. No CVA tenderness. Genitourinary RN present for pelvic exam. No external rashes or lesions. White vaginal discharge in canal. No cervical motion tenderness. Musculoskeletal: Full range of motion to all extremities. No gross deformities appreciated. Neurologic:  Normal speech and language. No gross focal neurologic deficits are appreciated.  Skin:  Skin is warm, dry and  intact. No rash noted.    ____________________________________________   LABS (all labs ordered are listed, but only abnormal results are displayed)  Labs Reviewed  WET PREP, GENITAL - Abnormal; Notable for the following components:      Result Value   WBC, Wet Prep HPF POC MANY (*)    All other components within normal limits  URINALYSIS, COMPLETE (UACMP) WITH MICROSCOPIC - Abnormal; Notable for the following components:   Color, Urine YELLOW (*)    APPearance CLEAR (*)    Squamous Epithelial / LPF 0-5 (*)    All other components within normal limits  CHLAMYDIA/NGC RT PCR ([facility] ONLY)  POC URINE PREG, ED  POCT PREGNANCY, URINE   ____________________________________________  EKG   ____________________________________________  RADIOLOGY   No results found.  ____________________________________________    PROCEDURES  Procedure(s) performed:    Procedures    Medications  metroNIDAZOLE (FLAGYL) tablet 2,000 mg (2,000 mg Oral Given 10/17/17 0841)     ____________________________________________   INITIAL IMPRESSION / ASSESSMENT AND PLAN / ED COURSE  Pertinent labs & imaging results that were available during my care of the patient were reviewed by me and considered in my medical decision making (see chart for details).  Review of the [place] CSRS was performed in accordance of the [facility] prior to dispensing any controlled drugs.   Patient's diagnosis is consistent with exposure to trichomoniasis and vaginitis. Vital signs and exam are reassuring.  Wet prep shows WBCs.  Gonorrhea and Chlamydia are negative.  Patient was given Flagyl for exposure and trichomoniasis. Patient is to follow up with PCP as directed. Patient is given ED precautions to return to the ED for any worsening or new symptoms.     ____________________________________________  FINAL CLINICAL IMPRESSION(S) / ED DIAGNOSES  Final diagnoses:  Exposure to STD  Acute vaginitis       NEW MEDICATIONS STARTED DURING THIS VISIT:  ED Discharge Orders    None          This chart was dictated using voice recognition software/Dragon. Despite best efforts to proofread, errors can occur which can change the meaning. Any change was purely unintentional.    Enid Derry, PA-C 10/17/17 1146    Dionne Bucy, MD 10/17/17 1526

## 2018-01-28 ENCOUNTER — Encounter: Payer: Self-pay | Admitting: Emergency Medicine

## 2018-01-28 DIAGNOSIS — R197 Diarrhea, unspecified: Secondary | ICD-10-CM | POA: Diagnosis not present

## 2018-01-28 DIAGNOSIS — F17210 Nicotine dependence, cigarettes, uncomplicated: Secondary | ICD-10-CM | POA: Insufficient documentation

## 2018-01-28 DIAGNOSIS — R1084 Generalized abdominal pain: Secondary | ICD-10-CM | POA: Diagnosis present

## 2018-01-28 LAB — URINALYSIS, COMPLETE (UACMP) WITH MICROSCOPIC
BILIRUBIN URINE: NEGATIVE
Bacteria, UA: NONE SEEN
GLUCOSE, UA: NEGATIVE mg/dL
HGB URINE DIPSTICK: NEGATIVE
KETONES UR: NEGATIVE mg/dL
LEUKOCYTES UA: NEGATIVE
Nitrite: NEGATIVE
PH: 6 (ref 5.0–8.0)
PROTEIN: NEGATIVE mg/dL
Specific Gravity, Urine: 1.023 (ref 1.005–1.030)
Squamous Epithelial / LPF: NONE SEEN (ref 0–5)

## 2018-01-28 LAB — COMPREHENSIVE METABOLIC PANEL
ALK PHOS: 91 U/L (ref 38–126)
ALT: 16 U/L (ref 0–44)
ANION GAP: 6 (ref 5–15)
AST: 16 U/L (ref 15–41)
Albumin: 4.3 g/dL (ref 3.5–5.0)
BUN: 9 mg/dL (ref 6–20)
CHLORIDE: 107 mmol/L (ref 98–111)
CO2: 27 mmol/L (ref 22–32)
Calcium: 9.3 mg/dL (ref 8.9–10.3)
Creatinine, Ser: 0.6 mg/dL (ref 0.44–1.00)
GFR calc Af Amer: >60 mL/min (ref >60)
GLUCOSE: 90 mg/dL (ref 70–99)
POTASSIUM: 3.9 mmol/L (ref 3.5–5.1)
SODIUM: 140 mmol/L (ref 135–145)
TOTAL PROTEIN: 7.5 g/dL (ref 6.5–8.1)
Total Bilirubin: 0.6 mg/dL (ref 0.3–1.2)

## 2018-01-28 LAB — CBC
HEMATOCRIT: 39.9 % (ref 35.0–47.0)
HEMOGLOBIN: 13.5 g/dL (ref 12.0–16.0)
MCH: 29.3 pg (ref 26.0–34.0)
MCHC: 33.8 g/dL (ref 32.0–36.0)
MCV: 86.6 fL (ref 80.0–100.0)
PLATELETS: 197 10*3/uL (ref 150–440)
RBC: 4.6 MIL/uL (ref 3.80–5.20)
RDW: 14.2 % (ref 11.5–14.5)
WBC: 11.5 10*3/uL — AB (ref 3.6–11.0)

## 2018-01-28 LAB — POCT PREGNANCY, URINE: Preg Test, Ur: NEGATIVE

## 2018-01-28 LAB — LIPASE, BLOOD: Lipase: 24 U/L (ref 11–51)

## 2018-01-28 NOTE — ED Triage Notes (Signed)
Patient with complaint of generalized abdominal pain and diarrhea that started about 18:00 tonight.

## 2018-01-29 ENCOUNTER — Emergency Department
Admission: EM | Admit: 2018-01-29 | Discharge: 2018-01-29 | Disposition: A | Discharge status: Home / Self Care | Payer: 59 | Attending: Emergency Medicine | Admitting: Emergency Medicine

## 2018-01-29 DIAGNOSIS — R197 Diarrhea, unspecified: Secondary | ICD-10-CM

## 2018-01-29 MED ORDER — LOPERAMIDE HCL 2 MG PO CAPS
4.0000 mg | ORAL_CAPSULE | Freq: Once | ORAL | Status: AC
  Administered 2018-01-29: 4 mg via ORAL
  Filled 2018-01-29: qty 2

## 2018-01-29 MED ORDER — SODIUM CHLORIDE 0.9 % IV BOLUS
1000.0000 mL | Freq: Once | INTRAVENOUS | Status: AC
  Administered 2018-01-29: 1000 mL via INTRAVENOUS

## 2018-01-29 NOTE — ED Notes (Signed)
Patient to stat desk in no acute distress. Patient given update on wait time. Patient verbalizes understanding.  

## 2018-01-29 NOTE — ED Provider Notes (Signed)
[facility] Emergency Department Provider Note ______________________   First MD Initiated Contact with Patient 01/29/18 [phone]     (approximate)  I have reviewed the triage vital signs and the nursing notes.   HISTORY  Chief Complaint Abdominal Pain and Diarrhea    HPI Nichole Carlson is a 22 y.o. female presents to the emergency department with generalized abdominal cramping and diarrhea which began at 6:00 PM last night.  Patient denies any fever no nausea or vomiting.  Patient describes current abdominal pain is cramping  Past Medical History:  Diagnosis Date  . UTI (lower urinary tract infection)   . Vertigo   . Yeast infection     There are no active problems to display for this patient.   Past Surgical History:  Procedure Laterality Date  . TONSILLECTOMY    . TONSILLECTOMY    . wisdonm teeth removed      Prior to Admission medications   Medication Sig Start Date End Date Taking? Authorizing Provider  benzonatate (TESSALON PERLES) 100 MG capsule Take 2 capsules (200 mg total) by mouth 3 (three) times daily as needed. 07/05/17 07/05/18  Tommi Rumps, PA-C  etonogestrel (NEXPLANON) 68 MG IMPL implant 68 mg by Subdermal route once. Implanted November 2015    [provider]  meclizine (ANTIVERT) 25 MG tablet Take 25 mg by mouth 3 (three) times daily as needed for dizziness.    [provider]    Allergies Fish allergy and Latex  No family history on file.  Social History Social History   Tobacco Use  . Smoking status: Current Every Day Smoker    Packs/day: 0.50    Types: Cigarettes  . Smokeless tobacco: Never Used  Substance Use Topics  . Alcohol use: Yes    Comment: occasional  . Drug use: Yes    Types: Marijuana    Review of Systems Constitutional: No fever/chills Eyes: No visual changes. ENT: No sore throat. Cardiovascular: Denies chest pain. Respiratory: Denies shortness of breath. Gastrointestinal:  Positive for abdominal cramping, nausea and diarrhea. Genitourinary: Negative for dysuria. Musculoskeletal: Negative for neck pain.  Negative for back pain. Integumentary: Negative for rash. Neurological: Negative for headaches, focal weakness or numbness.   ____________________________________________   PHYSICAL EXAM:  VITAL SIGNS: ED Triage Vitals [01/28/18 2305]  Enc Vitals Group     BP 137/60     Pulse Rate 68     Resp 18     Temp 98.6 F (37 C)     Temp Source Oral     SpO2 100 %     Weight (!) 144.2 kg (318 lb)     Height 1.753 m (5\' 9" )     Head Circumference      Peak Flow      Pain Score 6     Pain Loc      Pain Edu?      Excl. in GC?     Constitutional: Alert and oriented. Well appearing and in no acute distress. Eyes: Conjunctivae are normal.  Head: Atraumatic. Mouth/Throat: Mucous membranes are moist. Oropharynx non-erythematous. Neck: No stridor.  Cardiovascular: Normal rate, regular rhythm. Good peripheral circulation. Grossly normal heart sounds. Respiratory: Normal respiratory effort.  No retractions. Lungs CTAB. Gastrointestinal: Soft and nontender. No distention.  Musculoskeletal: No lower extremity tenderness nor edema. No gross deformities of extremities. Neurologic:  Normal speech and language. No gross focal neurologic deficits are appreciated.  Skin:  Skin is warm, dry and intact. No rash  noted. Psychiatric: Mood and affect are normal. Speech and behavior are normal.  ____________________________________________   LABS (all labs ordered are listed, but only abnormal results are displayed)  Labs Reviewed  CBC - Abnormal; Notable for the following components:      Result Value   WBC 11.5 (*)    All other components within normal limits  URINALYSIS, COMPLETE (UACMP) WITH MICROSCOPIC - Abnormal; Notable for the following components:   Color, Urine YELLOW (*)    APPearance CLEAR (*)    All other components within normal limits  LIPASE, BLOOD   COMPREHENSIVE METABOLIC PANEL  POC URINE PREG, ED  POCT PREGNANCY, URINE       Procedures   ____________________________________________   INITIAL IMPRESSION / ASSESSMENT AND PLAN / ED COURSE  As part of my medical decision making, I reviewed the following data within the electronic MEDICAL RECORD NUMBER 22 year old female presented with above-stated history and physical exam secondary to abdominal cramping and diarrhea.  Patient states last diarrheal episode was 8 PM last night.  No reproducible abdominal pain on exam laboratory data unremarkable.  Suspect infectious diarrhea as the etiology for the patient's symptoms.  Patient given 1 L IV normal saline and 4 mg of Imodium.  Spoke with patient at length regarding management at home. ____________________________________________  FINAL CLINICAL IMPRESSION(S) / ED DIAGNOSES  Final diagnoses:  Diarrhea of presumed infectious origin     MEDICATIONS GIVEN DURING THIS VISIT:  Medications  sodium chloride 0.9 % bolus 1,000 mL (1,000 mLs Intravenous New Bag/Given 01/29/18 0401)  loperamide (IMODIUM) capsule 4 mg (4 mg Oral Given 01/29/18 0401)     ED Discharge Orders    None       Note:  This document was prepared using Dragon voice recognition software and may include unintentional dictation errors.    Darci CurrentBrown, Shorter N, MD 01/29/18 [phone]

## 2018-01-29 NOTE — ED Notes (Signed)
Per MD Manson PasseyBrown, pt's fluid intake sufficient for DC.

## 2018-01-29 NOTE — ED Notes (Signed)
Patient to stat desk in no acute distress. Patient asking about wait time. Patient given update on wait time. Patient verbalizes understanding.

## 2018-05-29 ENCOUNTER — Emergency Department
Admission: EM | Admit: 2018-05-29 | Discharge: 2018-05-29 | Disposition: A | Discharge status: Home / Self Care | Payer: 59 | Attending: Emergency Medicine | Admitting: Emergency Medicine

## 2018-05-29 ENCOUNTER — Emergency Department: Payer: 59

## 2018-05-29 ENCOUNTER — Encounter: Payer: Self-pay | Admitting: Emergency Medicine

## 2018-05-29 ENCOUNTER — Other Ambulatory Visit: Payer: Self-pay

## 2018-05-29 DIAGNOSIS — B9689 Other specified bacterial agents as the cause of diseases classified elsewhere: Secondary | ICD-10-CM | POA: Diagnosis not present

## 2018-05-29 DIAGNOSIS — F17210 Nicotine dependence, cigarettes, uncomplicated: Secondary | ICD-10-CM | POA: Diagnosis not present

## 2018-05-29 DIAGNOSIS — N760 Acute vaginitis: Secondary | ICD-10-CM | POA: Insufficient documentation

## 2018-05-29 DIAGNOSIS — Z91040 Latex allergy status: Secondary | ICD-10-CM | POA: Insufficient documentation

## 2018-05-29 DIAGNOSIS — R102 Pelvic and perineal pain: Secondary | ICD-10-CM

## 2018-05-29 DIAGNOSIS — H9203 Otalgia, bilateral: Secondary | ICD-10-CM | POA: Diagnosis present

## 2018-05-29 DIAGNOSIS — R51 Headache: Secondary | ICD-10-CM | POA: Diagnosis not present

## 2018-05-29 DIAGNOSIS — R519 Headache, unspecified: Secondary | ICD-10-CM

## 2018-05-29 LAB — COMPREHENSIVE METABOLIC PANEL
ALBUMIN: 3.9 g/dL (ref 3.5–5.0)
ALK PHOS: 90 U/L (ref 38–126)
ALT: 17 U/L (ref 0–44)
ANION GAP: 7 (ref 5–15)
AST: 19 U/L (ref 15–41)
BILIRUBIN TOTAL: 0.4 mg/dL (ref 0.3–1.2)
BUN: 11 mg/dL (ref 6–20)
CALCIUM: 9 mg/dL (ref 8.9–10.3)
CO2: 25 mmol/L (ref 22–32)
Chloride: 108 mmol/L (ref 98–111)
Creatinine, Ser: 0.6 mg/dL (ref 0.44–1.00)
GFR calc non Af Amer: >60 mL/min (ref >60)
GLUCOSE: 104 mg/dL — AB (ref 70–99)
Potassium: 3.6 mmol/L (ref 3.5–5.1)
Sodium: 140 mmol/L (ref 135–145)
TOTAL PROTEIN: 7.3 g/dL (ref 6.5–8.1)

## 2018-05-29 LAB — CBC
HCT: 38.9 % (ref 36.0–46.0)
HEMOGLOBIN: 12.6 g/dL (ref 12.0–15.0)
MCH: 28.3 pg (ref 26.0–34.0)
MCHC: 32.4 g/dL (ref 30.0–36.0)
MCV: 87.4 fL (ref 80.0–100.0)
NRBC: 0 % (ref 0.0–0.2)
PLATELETS: 229 10*3/uL (ref 150–400)
RBC: 4.45 MIL/uL (ref 3.87–5.11)
RDW: 13.4 % (ref 11.5–15.5)
WBC: 11 10*3/uL — AB (ref 4.0–10.5)

## 2018-05-29 LAB — URINALYSIS, COMPLETE (UACMP) WITH MICROSCOPIC
Bilirubin Urine: NEGATIVE
Glucose, UA: NEGATIVE mg/dL
HGB URINE DIPSTICK: NEGATIVE
Ketones, ur: NEGATIVE mg/dL
LEUKOCYTES UA: NEGATIVE
NITRITE: NEGATIVE
PROTEIN: NEGATIVE mg/dL
SPECIFIC GRAVITY, URINE: 1.021 (ref 1.005–1.030)
pH: 8 (ref 5.0–8.0)

## 2018-05-29 LAB — WET PREP, GENITAL
SPERM: NONE SEEN
Trich, Wet Prep: NONE SEEN
YEAST WET PREP: NONE SEEN

## 2018-05-29 LAB — CHLAMYDIA/NGC RT PCR (ARMC ONLY)
Chlamydia Tr: NOT DETECTED
N gonorrhoeae: NOT DETECTED

## 2018-05-29 LAB — LIPASE, BLOOD: Lipase: 26 U/L (ref 11–51)

## 2018-05-29 MED ORDER — BUTALBITAL-APAP-CAFFEINE 50-325-40 MG PO TABS: 1.0000 | ORAL_TABLET | Freq: Four times a day (QID) | ORAL | 0 refills | Status: DC | PRN

## 2018-05-29 MED ORDER — AZITHROMYCIN 500 MG PO TABS
1000.0000 mg | ORAL_TABLET | Freq: Once | ORAL | Status: AC
  Administered 2018-05-29: 1000 mg via ORAL
  Filled 2018-05-29: qty 2

## 2018-05-29 MED ORDER — BUTALBITAL-APAP-CAFFEINE 50-325-40 MG PO TABS: 1.0000 | ORAL_TABLET | Freq: Four times a day (QID) | ORAL | 0 refills | Status: AC | PRN

## 2018-05-29 MED ORDER — METRONIDAZOLE 500 MG PO TABS: 500.0000 mg | ORAL_TABLET | Freq: Two times a day (BID) | ORAL | 0 refills | Status: DC

## 2018-05-29 MED ORDER — METRONIDAZOLE 500 MG PO TABS: 500.0000 mg | ORAL_TABLET | Freq: Two times a day (BID) | ORAL | 0 refills | Status: AC

## 2018-05-29 MED ORDER — BUTALBITAL-APAP-CAFFEINE 50-325-40 MG PO TABS
1.0000 | ORAL_TABLET | Freq: Once | ORAL | Status: AC
  Administered 2018-05-29: 1 via ORAL
  Filled 2018-05-29: qty 1

## 2018-05-29 MED ORDER — DOXYCYCLINE HYCLATE 100 MG PO CAPS: 100.0000 mg | ORAL_CAPSULE | Freq: Two times a day (BID) | ORAL | 8 refills | Status: DC

## 2018-05-29 MED ORDER — AZITHROMYCIN 1 G PO PACK: 1.0000 g | PACK | Freq: Once | ORAL | Status: DC

## 2018-05-29 MED ORDER — CEFTRIAXONE SODIUM 250 MG IJ SOLR
250.0000 mg | Freq: Once | INTRAMUSCULAR | Status: AC
  Administered 2018-05-29: 250 mg via INTRAMUSCULAR
  Filled 2018-05-29: qty 250

## 2018-05-29 MED ORDER — LIDOCAINE HCL (PF) 1 % IJ SOLN
INTRAMUSCULAR | Status: AC
  Administered 2018-05-29: 0.9 mL
  Filled 2018-05-29: qty 5

## 2018-05-29 NOTE — Discharge Instructions (Addendum)
Please seek medical attention for any high fevers, chest pain, shortness of breath, change in behavior, persistent vomiting, bloody stool or any other new or concerning symptoms.  

## 2018-05-29 NOTE — ED Triage Notes (Signed)
Pt arrived with multiple complaints including headache, mid abdominal pain, bilateral ear pain, and vaginal discharge. Pt states she just finished her period. Pt states she has a hx of migraines but states this "is worse."

## 2018-05-29 NOTE — ED Notes (Signed)
Patient transported to Ultrasound 

## 2018-05-29 NOTE — ED Provider Notes (Signed)
[facility] Emergency Department Provider Note  ____________________________________________   I have reviewed the triage vital signs and the nursing notes.   HISTORY  Chief Complaint Ear Pain  History limited by: Not Limited   HPI Nichole Carlson is a 22 y.o. female who presents to the emergency department today with primary complaint of ear pain.  She states it is bilateral however slightly worse in the left ear.  She describes it as sharp.  It started 1 week ago.  She thinks that it might be related to the fact that she needs a root canal tomorrow.  In addition she has also had abnormal vaginal discharge.  She states is been present for the past 2 weeks.  Lastly she is also complaining of headache.  She states she has a history of migraines and has had one for the past 48 hours.  She has tried taking BC powder without any significant relief.  Has had some nausea.  She denies any fevers.  Per medical record review patient has a history of UTI, yeast infection  Past Medical History:  Diagnosis Date  . UTI (lower urinary tract infection)   . Vertigo   . Yeast infection     There are no active problems to display for this patient.   Past Surgical History:  Procedure Laterality Date  . TONSILLECTOMY    . TONSILLECTOMY    . wisdonm teeth removed      Prior to Admission medications   Medication Sig Start Date End Date Taking? Authorizing Provider  benzonatate (TESSALON PERLES) 100 MG capsule Take 2 capsules (200 mg total) by mouth 3 (three) times daily as needed. 07/05/17 07/05/18  Tommi Rumps, PA-C  etonogestrel (NEXPLANON) 68 MG IMPL implant 68 mg by Subdermal route once. Implanted November 2015    [provider]  meclizine (ANTIVERT) 25 MG tablet Take 25 mg by mouth 3 (three) times daily as needed for dizziness.    [provider]    Allergies Fish allergy and Latex  No family history on file.  Social History Social History    Tobacco Use  . Smoking status: Current Every Day Smoker    Packs/day: 0.50    Types: Cigarettes  . Smokeless tobacco: Never Used  Substance Use Topics  . Alcohol use: Yes    Comment: occasional  . Drug use: Yes    Types: Marijuana    Review of Systems Constitutional: No fever/chills Eyes: No visual changes. ENT: No sore throat. Cardiovascular: Denies chest pain. Respiratory: Denies shortness of breath. Gastrointestinal: No abdominal pain.  Positive for nausea.   Genitourinary: Positive for abnormal vaginal discharge.  Musculoskeletal: Negative for back pain. Skin: Negative for rash. Neurological: Positive for headache.  ____________________________________________   PHYSICAL EXAM:  VITAL SIGNS: ED Triage Vitals [05/29/18 1723]  Enc Vitals Group     BP 122/78     Pulse Rate 78     Resp 18     Temp 97.8 F (36.6 C)     Temp Source Oral     SpO2 98 %     Weight (!) 320 lb (145.2 kg)     Height 5\' 9"  (1.753 m)   Constitutional: Alert and oriented.  Eyes: Conjunctivae are normal.  ENT      Head: Normocephalic and atraumatic.      Nose: No congestion/rhinnorhea.      Mouth/Throat: Mucous membranes are moist.      Neck: No stridor. Hematological/Lymphatic/Immunilogical: No cervical lymphadenopathy. Cardiovascular:  Normal rate, regular rhythm.  No murmurs, rubs, or gallops.  Respiratory: Normal respiratory effort without tachypnea nor retractions. Breath sounds are clear and equal bilaterally. No wheezes/rales/rhonchi. Gastrointestinal: Soft and minimally tender to the lower abdomen. No rebound. No guarding.  Genitourinary: No external lesions. Some pus noted in vaginal vault. No CMT. Exam performed with Jacki ConesLaurie RN Musculoskeletal: Normal range of motion in all extremities. No lower extremity edema. Neurologic:  Normal speech and language. No gross focal neurologic deficits are appreciated.  Skin:  Skin is warm, dry and intact. No rash noted. Psychiatric: Mood and  affect are normal. Speech and behavior are normal. Patient exhibits appropriate insight and judgment.  ____________________________________________    LABS (pertinent positives/negatives)  CMP wnl except glu 104 Lipase 26 CBC wbc 11.0, hgb 12.6, plt 229 UA hazy, rare bacteria otherwise unremarkable.   ____________________________________________   EKG  None  ____________________________________________    RADIOLOGY  US pelvic  No acute findings ____________________________________________   PROCEDURES  Procedures  ____________________________________________   INITIAL IMPRESSION / ASSESSMENT AND PLAN / ED COURSE  Pertinent labs & imaging results that were available during my care of the patient were reviewed by me and considered in my medical decision making (see chart for details).   Patient presented to the emergency department today with multiple complaints.  Terms of the headache she did feel better after Fioricet.  She was also complained of vaginal discharge.  On exam she did have some pus in the vaginal vault.  Because of this patient was given dose of ceftriaxone here in the emergency department.  Will treat for PID.  Ultrasound was performed to evaluate for any tubo-ovarian abscess however this.  Discussed the findings and plan with patient.  ____________________________________________   FINAL CLINICAL IMPRESSION(S) / ED DIAGNOSES  Final diagnoses:  Left adnexal tenderness  Bacterial vaginosis  Bad headache     Note: This dictation was prepared with Dragon dictation. Any transcriptional errors that result from this process are unintentional     Phineas SemenGoodman, Jaimie Pippins, MD 05/29/18 [phone]

## 2018-05-29 NOTE — ED Notes (Signed)
Pelvic exam by Dr Derrill KayGoodman with this RN present

## 2018-05-29 NOTE — ED Notes (Signed)
Topaz signature pad not working. Pt verbalized understanding of discharge instructions.

## 2018-05-31 LAB — HIV ANTIBODY (ROUTINE TESTING W REFLEX): HIV Screen 4th Generation wRfx: NONREACTIVE

## 2018-05-31 LAB — RPR: RPR: NONREACTIVE

## 2018-11-20 ENCOUNTER — Emergency Department
Admission: EM | Admit: 2018-11-20 | Discharge: 2018-11-20 | Disposition: A | Discharge status: Home / Self Care | Payer: 59 | Attending: Emergency Medicine | Admitting: Emergency Medicine

## 2018-11-20 ENCOUNTER — Other Ambulatory Visit: Payer: Self-pay

## 2018-11-20 ENCOUNTER — Encounter: Payer: Self-pay | Admitting: Emergency Medicine

## 2018-11-20 ENCOUNTER — Emergency Department: Payer: 59

## 2018-11-20 DIAGNOSIS — Z79899 Other long term (current) drug therapy: Secondary | ICD-10-CM | POA: Insufficient documentation

## 2018-11-20 DIAGNOSIS — Z91040 Latex allergy status: Secondary | ICD-10-CM | POA: Insufficient documentation

## 2018-11-20 DIAGNOSIS — R1013 Epigastric pain: Secondary | ICD-10-CM | POA: Diagnosis not present

## 2018-11-20 LAB — COMPREHENSIVE METABOLIC PANEL
ALT: 17 U/L (ref 0–44)
AST: 16 U/L (ref 15–41)
Albumin: 4.1 g/dL (ref 3.5–5.0)
Alkaline Phosphatase: 106 U/L (ref 38–126)
Anion gap: 7 (ref 5–15)
BUN: 12 mg/dL (ref 6–20)
CO2: 26 mmol/L (ref 22–32)
Calcium: 9 mg/dL (ref 8.9–10.3)
Chloride: 105 mmol/L (ref 98–111)
Creatinine, Ser: 0.54 mg/dL (ref 0.44–1.00)
GFR calc Af Amer: >60 mL/min (ref >60)
GFR calc non Af Amer: >60 mL/min (ref >60)
Glucose, Bld: 98 mg/dL (ref 70–99)
Potassium: 3.5 mmol/L (ref 3.5–5.1)
Sodium: 138 mmol/L (ref 135–145)
Total Bilirubin: 0.4 mg/dL (ref 0.3–1.2)
Total Protein: 7.1 g/dL (ref 6.5–8.1)

## 2018-11-20 LAB — CBC
HCT: 39.5 % (ref 36.0–46.0)
Hemoglobin: 12.6 g/dL (ref 12.0–15.0)
MCH: 27.8 pg (ref 26.0–34.0)
MCHC: 31.9 g/dL (ref 30.0–36.0)
MCV: 87.2 fL (ref 80.0–100.0)
Platelets: 198 10*3/uL (ref 150–400)
RBC: 4.53 MIL/uL (ref 3.87–5.11)
RDW: 13.7 % (ref 11.5–15.5)
WBC: 12.7 10*3/uL — ABNORMAL HIGH (ref 4.0–10.5)
nRBC: 0 % (ref 0.0–0.2)

## 2018-11-20 LAB — URINALYSIS, COMPLETE (UACMP) WITH MICROSCOPIC
Bacteria, UA: NONE SEEN
Bilirubin Urine: NEGATIVE
Glucose, UA: NEGATIVE mg/dL
Hgb urine dipstick: NEGATIVE
Ketones, ur: NEGATIVE mg/dL
Leukocytes,Ua: NEGATIVE
Nitrite: NEGATIVE
Protein, ur: NEGATIVE mg/dL
Specific Gravity, Urine: 1.026 (ref 1.005–1.030)
pH: 6 (ref 5.0–8.0)

## 2018-11-20 LAB — POCT PREGNANCY, URINE: Preg Test, Ur: NEGATIVE

## 2018-11-20 LAB — LIPASE, BLOOD: Lipase: 25 U/L (ref 11–51)

## 2018-11-20 MED ORDER — ALUM & MAG HYDROXIDE-SIMETH 200-200-20 MG/5ML PO SUSP
30.0000 mL | Freq: Once | ORAL | Status: AC
  Administered 2018-11-20: 30 mL via ORAL
  Filled 2018-11-20: qty 30

## 2018-11-20 MED ORDER — FAMOTIDINE 20 MG PO TABS
20.0000 mg | ORAL_TABLET | Freq: Once | ORAL | Status: AC
  Administered 2018-11-20: 20 mg via ORAL
  Filled 2018-11-20: qty 1

## 2018-11-20 MED ORDER — FAMOTIDINE 40 MG PO TABS: 40.0000 mg | ORAL_TABLET | Freq: Every evening | ORAL | 1 refills | Status: DC

## 2018-11-20 MED ORDER — DICYCLOMINE HCL 10 MG/5ML PO SOLN
10.0000 mg | Freq: Once | ORAL | Status: AC
  Administered 2018-11-20: 10 mg via ORAL
  Filled 2018-11-20 (×2): qty 5

## 2018-11-20 MED ORDER — LIDOCAINE VISCOUS HCL 2 % MT SOLN
15.0000 mL | Freq: Once | OROMUCOSAL | Status: AC
  Administered 2018-11-20: 15 mL via ORAL
  Filled 2018-11-20: qty 15

## 2018-11-20 NOTE — Discharge Instructions (Addendum)
Please return for worse pain fever vomiting.  Please follow-up with your regular doctor in the next week or 2.  Since the pain got better with the GI cocktail it may be due to some gastritis or stomach irritation.  I will give you a prescription for some Pepcid once a day.  We will give you the first dose in the emergency room and he can start taking more in the evening.  Again please return for any further problems.

## 2018-11-20 NOTE — ED Triage Notes (Signed)
Pt reports pain in the "pit" of her stomach and low midline; denies urinary s/s; reports clear vaginal discharge; symptoms started Friday; having N/V; pt says there is a chance she could be pregnant

## 2018-11-20 NOTE — ED Provider Notes (Signed)
[facility] Emergency Department Provider Note   ____________________________________________   First MD Initiated Contact with Patient 11/20/18 [phone]     (approximate)  I have reviewed the triage vital signs and the nursing notes.   HISTORY  Chief Complaint Abdominal Pain   HPI Nichole Carlson is a 23 y.o. female who comes in complaining of some abdominal discomfort.  The pain is mostly in the epigastric area.  She had some in the lower abdomen but when I saw her it seemed to be gone now.  Reports nothing seems to make it better or worse.  She was constipated did have a stool yesterday and feels a little bit better now.  She still having some epigastric discomfort is just kind of an dull pain there.  Mild to moderate.  Seems to be constant.  Been going on for about 2 days.     Past Medical History:  Diagnosis Date  . UTI (lower urinary tract infection)   . Vertigo   . Yeast infection     There are no active problems to display for this patient.   Past Surgical History:  Procedure Laterality Date  . TONSILLECTOMY    . TONSILLECTOMY    . wisdonm teeth removed      Prior to Admission medications   Medication Sig Start Date End Date Taking? Authorizing Provider  butalbital-acetaminophen-caffeine (FIORICET, ESGIC) 50-325-40 MG tablet Take 1 tablet by mouth every 6 (six) hours as needed for headache. 05/29/18 05/29/19 Yes Phineas SemenGoodman, [place], MD  diphenhydrAMINE (BENADRYL) 50 MG capsule Take 50 mg by mouth at bedtime as needed.   Yes [provider]  meclizine (ANTIVERT) 25 MG tablet Take 25 mg by mouth 3 (three) times daily as needed for dizziness.   Yes [provider]  famotidine (PEPCID) 40 MG tablet Take 1 tablet (40 mg total) by mouth every evening. 11/20/18 11/20/19  Arnaldo NatalMalinda,  F, MD    Allergies Fish allergy and Latex  History reviewed. No pertinent family history.  Social History Social History   Tobacco Use  . Smoking  status: Former Smoker    Packs/day: 0.50    Types: Cigarettes  . Smokeless tobacco: Never Used  Substance Use Topics  . Alcohol use: Yes    Comment: occasional  . Drug use: Yes    Types: Marijuana    Comment: last smoked saturday morning    Review of Systems  Constitutional: No fever/chills Eyes: No visual changes. ENT: No sore throat. Cardiovascular: Denies chest pain. Respiratory: Denies shortness of breath. Gastrointestinal: Epigastric abdominal pain.  No nausea, no vomiting.  No diarrhea.  No constipation. Genitourinary: Negative for dysuria. Musculoskeletal: Negative for back pain. Skin: Negative for rash. Neurological: Negative for headaches, focal weakness   ____________________________________________   PHYSICAL EXAM:  VITAL SIGNS: ED Triage Vitals  Enc Vitals Group     BP 11/20/18 0258 134/80     Pulse Rate 11/20/18 0258 70     Resp 11/20/18 0258 17     Temp 11/20/18 0258 97.7 F (36.5 C)     Temp Source 11/20/18 0258 Oral     SpO2 11/20/18 0258 100 %     Weight 11/20/18 0256 (!) 320 lb (145.2 kg)     Height 11/20/18 0256 5\' 9"  (1.753 m)     Head Circumference --      Peak Flow --      Pain Score 11/20/18 0255 8     Pain Loc --  Pain Edu? --      Excl. in GC? --     Constitutional: Alert and oriented. Well appearing and in no acute distress. Eyes: Conjunctivae are normal.  Head: Atraumatic. Nose: No congestion/rhinnorhea. Mouth/Throat: Mucous membranes are moist.  Oropharynx non-erythematous. Neck: No stridor. Cardiovascular: Normal rate, regular rhythm. Grossly normal heart sounds.  Good peripheral circulation. Respiratory: Normal respiratory effort.  No retractions. Lungs CTAB. Gastrointestinal: Soft and nontender except to palpation in the epigastric area and minimally in the right upper quadrant.. No distention. No abdominal bruits. No CVA tenderness. Musculoskeletal: No lower extremity tenderness nor edema.   Neurologic:  Normal speech and  language. No gross focal neurologic deficits are appreciated. N Skin:  Skin is warm, dry and intact. No rash noted.   ____________________________________________   LABS (all labs ordered are listed, but only abnormal results are displayed)  Labs Reviewed  CBC - Abnormal; Notable for the following components:      Result Value   WBC 12.7 (*)    All other components within normal limits  URINALYSIS, COMPLETE (UACMP) WITH MICROSCOPIC - Abnormal; Notable for the following components:   Color, Urine YELLOW (*)    APPearance CLEAR (*)    All other components within normal limits  LIPASE, BLOOD  COMPREHENSIVE METABOLIC PANEL  POCT PREGNANCY, URINE  POC URINE PREG, ED   ____________________________________________  EKG   ____________________________________________  RADIOLOGY  ED MD interpretation:    Official radiology report(s): Dg Abdomen 1 View  Result Date: 11/20/2018 CLINICAL DATA:  Abdominal pain EXAM: ABDOMEN - 1 VIEW COMPARISON:  September 03, 2016 FINDINGS: There is moderate stool in the colon. There is no bowel dilatation or air-fluid level to suggest bowel obstruction. No free air. No abnormal calcifications. IMPRESSION: Moderate stool in colon. No bowel obstruction or free air. No abnormal calcifications. Electronically Signed   By: Bretta Bang III M.D.   On: 11/20/2018 09:00    ____________________________________________   PROCEDURES  Procedure(s) performed (including Critical Care):  Procedures   ____________________________________________   INITIAL IMPRESSION / ASSESSMENT AND PLAN / ED COURSE Patient feels better after GI cocktail.  I will give her some Pepcid for several days to see if this helps.  She will come back if she is worse or gets a fever or has any other problems.Nichole Furry was evaluated in Emergency Department on 11/20/2018 for the symptoms described in the history of present illness. She was evaluated in the context of the global  COVID-19 pandemic, which necessitated consideration that the patient might be at risk for infection with the SARS-CoV-2 virus that causes COVID-19. Institutional protocols and algorithms that pertain to the evaluation of patients at risk for COVID-19 are in a state of rapid change based on information released by regulatory bodies including the CDC and federal and state organizations. These policies and algorithms were followed during the patient's care in the ED.              ____________________________________________   FINAL CLINICAL IMPRESSION(S) / ED DIAGNOSES  Final diagnoses:  Epigastric pain     ED Discharge Orders         Ordered    famotidine (PEPCID) 40 MG tablet  Every evening     11/20/18 1039           Note:  This document was prepared using Dragon voice recognition software and may include unintentional dictation errors.    Arnaldo Natal, MD 11/20/18 [phone]

## 2019-04-23 ENCOUNTER — Other Ambulatory Visit: Payer: Self-pay

## 2019-04-23 ENCOUNTER — Emergency Department
Admission: EM | Admit: 2019-04-23 | Discharge: 2019-04-23 | Disposition: A | Discharge status: Home / Self Care | Payer: 59 | Attending: Emergency Medicine | Admitting: Emergency Medicine

## 2019-04-23 DIAGNOSIS — Z87891 Personal history of nicotine dependence: Secondary | ICD-10-CM | POA: Diagnosis not present

## 2019-04-23 DIAGNOSIS — Z20828 Contact with and (suspected) exposure to other viral communicable diseases: Secondary | ICD-10-CM | POA: Insufficient documentation

## 2019-04-23 DIAGNOSIS — R5383 Other fatigue: Secondary | ICD-10-CM | POA: Diagnosis present

## 2019-04-23 LAB — CBC WITH DIFFERENTIAL/PLATELET
Abs Immature Granulocytes: 0.04 10*3/uL (ref 0.00–0.07)
Basophils Absolute: 0 10*3/uL (ref 0.0–0.1)
Basophils Relative: 0 %
Eosinophils Absolute: 0.5 10*3/uL (ref 0.0–0.5)
Eosinophils Relative: 5 %
HCT: 41.1 % (ref 36.0–46.0)
Hemoglobin: 13.2 g/dL (ref 12.0–15.0)
Immature Granulocytes: 0 %
Lymphocytes Relative: 29 %
Lymphs Abs: 2.9 10*3/uL (ref 0.7–4.0)
MCH: 28 pg (ref 26.0–34.0)
MCHC: 32.1 g/dL (ref 30.0–36.0)
MCV: 87.1 fL (ref 80.0–100.0)
Monocytes Absolute: 0.9 10*3/uL (ref 0.1–1.0)
Monocytes Relative: 9 %
Neutro Abs: 5.5 10*3/uL (ref 1.7–7.7)
Neutrophils Relative %: 57 %
Platelets: 217 10*3/uL (ref 150–400)
RBC: 4.72 MIL/uL (ref 3.87–5.11)
RDW: 13.3 % (ref 11.5–15.5)
WBC: 9.9 10*3/uL (ref 4.0–10.5)
nRBC: 0 % (ref 0.0–0.2)

## 2019-04-23 LAB — URINALYSIS, ROUTINE W REFLEX MICROSCOPIC
Bilirubin Urine: NEGATIVE
Glucose, UA: NEGATIVE mg/dL
Hgb urine dipstick: NEGATIVE
Ketones, ur: NEGATIVE mg/dL
Leukocytes,Ua: NEGATIVE
Nitrite: NEGATIVE
Protein, ur: NEGATIVE mg/dL
Specific Gravity, Urine: 1.03 (ref 1.005–1.030)
pH: 5 (ref 5.0–8.0)

## 2019-04-23 LAB — BASIC METABOLIC PANEL
Anion gap: 7 (ref 5–15)
BUN: 12 mg/dL (ref 6–20)
CO2: 26 mmol/L (ref 22–32)
Calcium: 8.9 mg/dL (ref 8.9–10.3)
Chloride: 107 mmol/L (ref 98–111)
Creatinine, Ser: 0.55 mg/dL (ref 0.44–1.00)
GFR calc Af Amer: >60 mL/min (ref >60)
GFR calc non Af Amer: >60 mL/min (ref >60)
Glucose, Bld: 98 mg/dL (ref 70–99)
Potassium: 3.5 mmol/L (ref 3.5–5.1)
Sodium: 140 mmol/L (ref 135–145)

## 2019-04-23 LAB — POCT PREGNANCY, URINE: Preg Test, Ur: NEGATIVE

## 2019-04-23 LAB — SARS CORONAVIRUS 2 (TAT 6-24 HRS): SARS Coronavirus 2: NEGATIVE

## 2019-04-23 NOTE — ED Provider Notes (Signed)
[facility] Emergency Department Provider Note  Time seen: 4:55 AM  I have reviewed the triage vital signs and the nursing notes.   HISTORY  Chief Complaint Fatigue   HPI Nichole Carlson is a 23 y.o. female with a past medical history of vertigo presents to the emergency department for generalized fatigue.  According to the patient around 4 PM yesterday she began feeling fatigued with chills and mild body aches.  States she felt "off."  Denies any cough but did state mild shortness of breath.  Also states nausea but no vomiting.  One episode of loose stool.  No abdominal pain.  No chest pain.  Largely negative review of systems otherwise.   Past Medical History:  Diagnosis Date  . UTI (lower urinary tract infection)   . Vertigo   . Yeast infection     There are no active problems to display for this patient.   Past Surgical History:  Procedure Laterality Date  . TONSILLECTOMY    . TONSILLECTOMY    . wisdonm teeth removed      Prior to Admission medications   Medication Sig Start Date End Date Taking? Authorizing Provider  butalbital-acetaminophen-caffeine (FIORICET, ESGIC) 50-325-40 MG tablet Take 1 tablet by mouth every 6 (six) hours as needed for headache. 05/29/18 05/29/19  Nance Pear, MD  diphenhydrAMINE (BENADRYL) 50 MG capsule Take 50 mg by mouth at bedtime as needed.    [provider]  famotidine (PEPCID) 40 MG tablet Take 1 tablet (40 mg total) by mouth every evening. 11/20/18 11/20/19  Nena Polio, MD  meclizine (ANTIVERT) 25 MG tablet Take 25 mg by mouth 3 (three) times daily as needed for dizziness.    [provider]    Allergies  Allergen Reactions  . Fish Allergy Anaphylaxis and Hives    Reaction to fish sticks   . Latex Itching    Vaginal itching    No family history on file.  Social History Social History   Tobacco Use  . Smoking status: Former Smoker    Packs/day: 0.50    Types: Cigarettes  .  Smokeless tobacco: Never Used  Substance Use Topics  . Alcohol use: Yes    Comment: occasional  . Drug use: Yes    Types: Marijuana    Comment: last smoked saturday morning    Review of Systems Constitutional: Negative for fever. Cardiovascular: Negative for chest pain. Respiratory: Mild shortness of breath.  Negative for cough. Gastrointestinal: Negative for abdominal pain.  Positive for nausea.  Negative for vomiting.  1 episode of diarrhea. Musculoskeletal: Negative for musculoskeletal complaints Neurological: Negative for headache All other ROS negative  ____________________________________________   PHYSICAL EXAM:  VITAL SIGNS: ED Triage Vitals  Enc Vitals Group     BP 04/23/19 0108 (!) 145/58     Pulse Rate 04/23/19 0108 67     Resp --      Temp 04/23/19 0108 98.4 F (36.9 C)     Temp Source 04/23/19 0108 Oral     SpO2 04/23/19 0108 100 %     Weight 04/23/19 0108 (!) 325 lb (147.4 kg)     Height 04/23/19 0108 5\' 9"  (1.753 m)     Head Circumference --      Peak Flow --      Pain Score 04/23/19 0118 10     Pain Loc --      Pain Edu? --      Excl. in [place]? --  Constitutional: Alert and oriented. Well appearing and in no distress. Eyes: Normal exam ENT      Head: Normocephalic and atraumatic.      Mouth/Throat: Mucous membranes are moist. Cardiovascular: Normal rate, regular rhythm. No murmur Respiratory: Normal respiratory effort without tachypnea nor retractions. Breath sounds are clear Gastrointestinal: Soft and nontender. No distention.   Musculoskeletal: Nontender with normal range of motion in all extremities.  Neurologic:  Normal speech and language. No gross focal neurologic deficits  Skin:  Skin is warm, dry and intact.  Psychiatric: Mood and affect are normal.    INITIAL IMPRESSION / ASSESSMENT AND PLAN / ED COURSE  Pertinent labs & imaging results that were available during my care of the patient were reviewed by me and considered in my medical  decision making (see chart for details).   Patient presents emergency department for fatigue, weakness, chills, nausea body aches.  Overall the patient appears very well on my examination.  Vitals reassuring.  Lab work is reassuring.  However given the patient's symptoms it is very likely that the patient is experiencing viral illness possibly Covid.  We will swab for Covid as a precaution.  I discussed isolation precautions and supportive care at home.  Patient agreeable to plan of care.  Nichole Carlson was evaluated in Emergency Department on 04/23/2019 for the symptoms described in the history of present illness. She was evaluated in the context of the global COVID-19 pandemic, which necessitated consideration that the patient might be at risk for infection with the SARS-CoV-2 virus that causes COVID-19. Institutional protocols and algorithms that pertain to the evaluation of patients at risk for COVID-19 are in a state of rapid change based on information released by regulatory bodies including the CDC and federal and state organizations. These policies and algorithms were followed during the patient's care in the ED.  ____________________________________________   FINAL CLINICAL IMPRESSION(S) / ED DIAGNOSES  Fatigue   Minna Antis, MD 04/23/19 [phone]

## 2019-04-23 NOTE — ED Triage Notes (Signed)
Patient reports Saturday around 4 pm started to feel "off"  States she feels very fatigued, generalized body aches and feels "hot" and headache.

## 2019-04-23 NOTE — ED Notes (Signed)
Collect UA att

## 2019-06-02 ENCOUNTER — Encounter (HOSPITAL_COMMUNITY): Payer: Self-pay | Admitting: Emergency Medicine

## 2019-06-02 ENCOUNTER — Emergency Department (HOSPITAL_COMMUNITY)
Admission: EM | Admit: 2019-06-02 | Discharge: 2019-06-03 | Disposition: A | Discharge status: Home / Self Care | Payer: 59 | Attending: Emergency Medicine | Admitting: Emergency Medicine

## 2019-06-02 ENCOUNTER — Other Ambulatory Visit: Payer: Self-pay

## 2019-06-02 DIAGNOSIS — Z5321 Procedure and treatment not carried out due to patient leaving prior to being seen by health care provider: Secondary | ICD-10-CM | POA: Diagnosis not present

## 2019-06-02 DIAGNOSIS — R109 Unspecified abdominal pain: Secondary | ICD-10-CM | POA: Insufficient documentation

## 2019-06-02 LAB — URINALYSIS, ROUTINE W REFLEX MICROSCOPIC
Bilirubin Urine: NEGATIVE
Glucose, UA: NEGATIVE mg/dL
Hgb urine dipstick: NEGATIVE
Ketones, ur: 20 mg/dL — AB
Leukocytes,Ua: NEGATIVE
Nitrite: NEGATIVE
Protein, ur: NEGATIVE mg/dL
Specific Gravity, Urine: 1.027 (ref 1.005–1.030)
pH: 5 (ref 5.0–8.0)

## 2019-06-02 LAB — CBC
HCT: 40.8 % (ref 36.0–46.0)
Hemoglobin: 12.9 g/dL (ref 12.0–15.0)
MCH: 28.3 pg (ref 26.0–34.0)
MCHC: 31.6 g/dL (ref 30.0–36.0)
MCV: 89.5 fL (ref 80.0–100.0)
Platelets: 210 10*3/uL (ref 150–400)
RBC: 4.56 MIL/uL (ref 3.87–5.11)
RDW: 13.1 % (ref 11.5–15.5)
WBC: 10.5 10*3/uL (ref 4.0–10.5)
nRBC: 0 % (ref 0.0–0.2)

## 2019-06-02 LAB — COMPREHENSIVE METABOLIC PANEL
ALT: 21 U/L (ref 0–44)
AST: 18 U/L (ref 15–41)
Albumin: 3.9 g/dL (ref 3.5–5.0)
Alkaline Phosphatase: 92 U/L (ref 38–126)
Anion gap: 10 (ref 5–15)
BUN: 7 mg/dL (ref 6–20)
CO2: 27 mmol/L (ref 22–32)
Calcium: 9.5 mg/dL (ref 8.9–10.3)
Chloride: 105 mmol/L (ref 98–111)
Creatinine, Ser: 0.65 mg/dL (ref 0.44–1.00)
GFR calc Af Amer: >60 mL/min (ref >60)
GFR calc non Af Amer: >60 mL/min (ref >60)
Glucose, Bld: 95 mg/dL (ref 70–99)
Potassium: 4 mmol/L (ref 3.5–5.1)
Sodium: 142 mmol/L (ref 135–145)
Total Bilirubin: 0.4 mg/dL (ref 0.3–1.2)
Total Protein: 7.2 g/dL (ref 6.5–8.1)

## 2019-06-02 LAB — LIPASE, BLOOD: Lipase: 20 U/L (ref 11–51)

## 2019-06-02 LAB — I-STAT BETA HCG BLOOD, ED (MC, WL, AP ONLY): I-stat hCG, quantitative: <5 m[IU]/mL (ref <5)

## 2019-06-02 MED ORDER — SODIUM CHLORIDE 0.9% FLUSH: 3.0000 mL | Freq: Once | INTRAVENOUS | Status: DC

## 2019-06-02 NOTE — ED Triage Notes (Signed)
Pt to ED with c/o lower abd pain x's 2 days.  Pt st's "it feels like bladder spasms"  Pt denies urinary symptoms.  Also c/o vag. discharge

## 2019-06-03 ENCOUNTER — Other Ambulatory Visit: Payer: Self-pay

## 2019-06-03 ENCOUNTER — Encounter: Payer: Self-pay | Admitting: Emergency Medicine

## 2019-06-03 ENCOUNTER — Emergency Department
Admission: EM | Admit: 2019-06-03 | Discharge: 2019-06-03 | Disposition: A | Discharge status: Home / Self Care | Payer: 59 | Attending: Emergency Medicine | Admitting: Emergency Medicine

## 2019-06-03 DIAGNOSIS — N898 Other specified noninflammatory disorders of vagina: Secondary | ICD-10-CM | POA: Diagnosis present

## 2019-06-03 DIAGNOSIS — A599 Trichomoniasis, unspecified: Secondary | ICD-10-CM | POA: Diagnosis not present

## 2019-06-03 DIAGNOSIS — F1210 Cannabis abuse, uncomplicated: Secondary | ICD-10-CM | POA: Diagnosis not present

## 2019-06-03 DIAGNOSIS — Z87891 Personal history of nicotine dependence: Secondary | ICD-10-CM | POA: Insufficient documentation

## 2019-06-03 DIAGNOSIS — Z79899 Other long term (current) drug therapy: Secondary | ICD-10-CM | POA: Insufficient documentation

## 2019-06-03 LAB — WET PREP, GENITAL
Clue Cells Wet Prep HPF POC: NONE SEEN
Sperm: NONE SEEN
Yeast Wet Prep HPF POC: NONE SEEN

## 2019-06-03 LAB — POCT PREGNANCY, URINE: Preg Test, Ur: NEGATIVE

## 2019-06-03 LAB — URINALYSIS, ROUTINE W REFLEX MICROSCOPIC
Bilirubin Urine: NEGATIVE
Glucose, UA: NEGATIVE mg/dL
Hgb urine dipstick: NEGATIVE
Ketones, ur: NEGATIVE mg/dL
Leukocytes,Ua: NEGATIVE
Nitrite: NEGATIVE
Protein, ur: NEGATIVE mg/dL
Specific Gravity, Urine: 1.029 (ref 1.005–1.030)
pH: 6 (ref 5.0–8.0)

## 2019-06-03 MED ORDER — METRONIDAZOLE 500 MG PO TABS
2000.0000 mg | ORAL_TABLET | Freq: Once | ORAL | Status: AC
  Administered 2019-06-03: 2000 mg via ORAL
  Filled 2019-06-03: qty 4

## 2019-06-03 MED ORDER — AZITHROMYCIN 500 MG PO TABS
1000.0000 mg | ORAL_TABLET | Freq: Once | ORAL | Status: AC
  Administered 2019-06-03: 1000 mg via ORAL
  Filled 2019-06-03: qty 2

## 2019-06-03 MED ORDER — CEFTRIAXONE SODIUM 250 MG IJ SOLR
250.0000 mg | Freq: Once | INTRAMUSCULAR | Status: AC
  Administered 2019-06-03: 250 mg via INTRAMUSCULAR
  Filled 2019-06-03: qty 250

## 2019-06-03 NOTE — ED Provider Notes (Signed)
[facility] Emergency Department Provider Note  ____________________________________________   First MD Initiated Contact with Patient 06/03/19 1349     (approximate)  I have reviewed the triage vital signs and the nursing notes.   HISTORY  Chief Complaint Vaginal Discharge and Bladder Pain    HPI Nichole Carlson is a 23 y.o. female presents emergency department complaining of lower abdominal pain along with some vaginal discharge.  Unsure if she has a STD and says it is quite possible.  She denies any fever or chills.  No vomiting or diarrhea.    Past Medical History:  Diagnosis Date  . UTI (lower urinary tract infection)   . Vertigo   . Yeast infection     There are no active problems to display for this patient.   Past Surgical History:  Procedure Laterality Date  . TONSILLECTOMY    . TONSILLECTOMY    . wisdonm teeth removed      Prior to Admission medications   Medication Sig Start Date End Date Taking? Authorizing Provider  diphenhydrAMINE (BENADRYL) 50 MG capsule Take 50 mg by mouth at bedtime as needed.    [provider]  famotidine (PEPCID) 40 MG tablet Take 1 tablet (40 mg total) by mouth every evening. 11/20/18 11/20/19  Arnaldo Natal, MD  meclizine (ANTIVERT) 25 MG tablet Take 25 mg by mouth 3 (three) times daily as needed for dizziness.    [provider]    Allergies Fish allergy and Latex  No family history on file.  Social History Social History   Tobacco Use  . Smoking status: Former Smoker    Packs/day: 0.50    Types: Cigarettes  . Smokeless tobacco: Never Used  Substance Use Topics  . Alcohol use: Yes    Comment: occasional  . Drug use: Yes    Types: Marijuana    Comment: last smoked saturday morning    Review of Systems  Constitutional: No fever/chills Eyes: No visual changes. ENT: No sore throat. Respiratory: Denies cough Genitourinary: Positive for dysuria.  Positive vaginal  discharge Musculoskeletal: Negative for back pain. Skin: Negative for rash.    ____________________________________________   PHYSICAL EXAM:  VITAL SIGNS: ED Triage Vitals  Enc Vitals Group     BP 06/03/19 1334 132/60     Pulse Rate 06/03/19 1334 76     Resp 06/03/19 1334 20     Temp 06/03/19 1334 98.7 F (37.1 C)     Temp Source 06/03/19 1334 Oral     SpO2 --      Weight 06/03/19 1330 (!) 330 lb (149.7 kg)     Height 06/03/19 1330 5\' 9"  (1.753 m)     Head Circumference --      Peak Flow --      Pain Score 06/03/19 1330 6     Pain Loc --      Pain Edu? --      Excl. in GC? --     Constitutional: Alert and oriented. Well appearing and in no acute distress. Eyes: Conjunctivae are normal.  Head: Atraumatic. Nose: No congestion/rhinnorhea. Mouth/Throat: Mucous membranes are moist.   Neck:  supple no lymphadenopathy noted Cardiovascular: Normal rate, regular rhythm. Respiratory: Normal respiratory effort.  No retractions,  Abd: soft nontender bs normal all 4 quad GU: External vaginal exam shows a clitoral piercing.  No herpetic lesions are noted, very little discharge is noted during the speculum exam. Musculoskeletal: FROM all extremities, warm and well perfused Neurologic:  Normal speech and language.  Skin:  Skin is warm, dry and intact. No rash noted. Psychiatric: Mood and affect are normal. Speech and behavior are normal.  ____________________________________________   LABS (all labs ordered are listed, but only abnormal results are displayed)  Labs Reviewed  WET PREP, GENITAL - Abnormal; Notable for the following components:      Result Value   Trich, Wet Prep PRESENT (*)    WBC, Wet Prep HPF POC RARE (*)    All other components within normal limits  URINALYSIS, ROUTINE W REFLEX MICROSCOPIC - Abnormal; Notable for the following components:   Color, Urine YELLOW (*)    APPearance CLEAR (*)    All other components within normal limits  GC/CHLAMYDIA PROBE AMP   POC URINE PREG, ED  POCT PREGNANCY, URINE   ____________________________________________   ____________________________________________  RADIOLOGY    ____________________________________________   PROCEDURES  Procedure(s) performed: Rocephin 250 mg IM, Zithromax 1 g p.o., metronidazole 2 g p.o.   Procedures    ____________________________________________   INITIAL IMPRESSION / ASSESSMENT AND PLAN / ED COURSE  Pertinent labs & imaging results that were available during my care of the patient were reviewed by me and considered in my medical decision making (see chart for details).   Patient complains of vaginal discharge and dysuria along with some lower abdominal pain.  See HPI  Physical exam shows the abdomen to be nontender.  Vaginal exam shows a small amount of vaginal discharge.  Wet prep is positive for trichomoniasis. Point-of-care pregnancy test is negative, urinalysis is negative  Explained test results to the patient.  She was given empirical treatment for STDs.  She was given a injection of Rocephin 250 mg, Zithromax 1 g p.o., metronidazole 2 g p.o.  Patient is notify her partner or partners of her STD.  They also need to be treated.  She was instructed to refrain from sex until all partners have been treated.  They should wait at least 7 days after being treated to return to having sexual relations.  Follow-up with health department if symptoms return.  She is discharged stable condition.     Nichole Carlson was evaluated in Emergency Department on 06/03/2019 for the symptoms described in the history of present illness. She was evaluated in the context of the global COVID-19 pandemic, which necessitated consideration that the patient might be at risk for infection with the SARS-CoV-2 virus that causes COVID-19. Institutional protocols and algorithms that pertain to the evaluation of patients at risk for COVID-19 are in a state of rapid change based on information  released by regulatory bodies including the CDC and federal and state organizations. These policies and algorithms were followed during the patient's care in the ED.   As part of my medical decision making, I reviewed the following data within the [place] notes reviewed and incorporated, Labs reviewed , Old chart reviewed, Notes from prior ED visits and [place] Controlled Substance Database  ____________________________________________   FINAL CLINICAL IMPRESSION(S) / ED DIAGNOSES  Final diagnoses:  Trichomoniasis  Vaginal discharge      NEW MEDICATIONS STARTED DURING THIS VISIT:  New Prescriptions   No medications on file     Note:  This document was prepared using Dragon voice recognition software and may include unintentional dictation errors.    Versie Starks, PA-C 06/03/19 1502    Vanessa Ventress, MD 06/05/19 [phone]

## 2019-06-03 NOTE — ED Triage Notes (Signed)
Pt arrived via POV with reports of lower abdominal pain, states she feels like it is her bladder, pt also c/o vaginal discharge, states she recently was ovulating and thinks it may be related to ovulation.

## 2019-06-03 NOTE — ED Notes (Signed)
No reaction noted to the IM injection

## 2019-06-03 NOTE — Discharge Instructions (Signed)
Follow-up with your regular doctor or the [facility] if your symptoms return.  Please notify any partners that you have trichomoniasis.  They will also need to be treated.  Do not have sex until both partners have been treated and you have waited approximately 7 days after treatment.  You have also been treated for gonorrhea and chlamydia.  Your test if not resulted at this time.  A nurse will call you with your test results so you can notify a partner if needed.

## 2019-06-30 NOTE — L&D Delivery Note (Addendum)
Nichole Carlson is a 24 y.o.@ s/p vaginal delivery at [redacted]w[redacted]d.  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    ROM: 17h 69m with clear fluid GBS Status: negative Maximum Maternal Temperature: 101.7 F   Labor Progress: Pt in latent labor on admission. She continued to progress well; SROM on 830 on 06/01/2020. Pitocin was also initiated and pt then was noted to have complete cervical dilation. She then delivered as noted below. During labor, patient noted to have temperature of 101.5, ampicillin and gentamicin administered prior to delivery as noted below.   Delivery Date/Time: 06/02/2020 at 0138 Delivery: Called to room and patient was complete and pushing. Head delivered direct occiput posterior. Loose nuchal cord present. Shoulder and body delivered in usual fashion. Infant with spontaneous cry, placed on mother's abdomen, dried and stimulated. Cord clamped x 2 after 1-minute delay, and cut by aunt of baby under my direct supervision. Cord blood drawn. Placenta delivered spontaneously with gentle cord traction. Fundus firm with massage and Pitocin. Labia, perineum, vagina, and cervix were inspected; 1st degree perineal laceration visualized and repaired.    Placenta: intact, 3-vessel cord, sent to L&D Complications: Chorioamnionitis s/p antibiotics prior to delivery Lacerations: 1st degree perineal laceration with standard repair using 3-0 vicryl EBL: 900 ml s/p cytotec & TXA, manual extraction of clots, I&O cath Analgesia: epidural   Infant: female  APGARs 8 & 9  weight: 3294 g  Anupa Ganta, DO  I was gloved and present for delivery of infant and placenta as noted above. I performed the vaginal laceration repair as noted.  Sheila Oats, MD OB Fellow, Faculty Practice 06/02/2020 10:33 AM

## 2019-10-19 ENCOUNTER — Emergency Department
Admission: EM | Admit: 2019-10-19 | Discharge: 2019-10-19 | Disposition: A | Discharge status: Home / Self Care | Payer: 59

## 2019-10-24 ENCOUNTER — Ambulatory Visit: Payer: 59 | Admitting: Obstetrics and Gynecology

## 2019-10-24 ENCOUNTER — Encounter: Payer: Self-pay | Admitting: Obstetrics and Gynecology

## 2019-10-24 ENCOUNTER — Other Ambulatory Visit: Payer: Self-pay

## 2019-10-24 VITALS — BP 132/87 | HR 91 | Ht 69.0 in | Wt 313.5 lb

## 2019-10-24 DIAGNOSIS — N912 Amenorrhea, unspecified: Secondary | ICD-10-CM | POA: Diagnosis not present

## 2019-10-24 NOTE — Progress Notes (Signed)
HPI:      [name] is a 24 y.o. G1P0 who LMP was Patient's last menstrual period was 09/09/2019.  Subjective:   She presents today for pregnancy confirmation.  This was not an intended pregnancy but she was not preventing it.  She is currently taking prenatal vitamins.  She has some breast tenderness but no nausea.  She had been trying to lose weight and was increasing her exercise level.     Hx: The following portions of the patient's history were reviewed and updated as appropriate:             She  has a past medical history of UTI (lower urinary tract infection), Vertigo, and Yeast infection. She does not have a problem list on file. She  has a past surgical history that includes Tonsillectomy; Tonsillectomy; and wisdonm teeth removed. Her family history is not on file. She  reports that she has quit smoking. Her smoking use included cigarettes. She smoked 0.50 packs per day. She has never used smokeless tobacco. She reports current alcohol use. She reports current drug use. Drug: Marijuana. She has a current medication list which includes the following prescription(s): meclizine and multivitamin-prenatal. She is allergic to fish allergy and latex.       Review of Systems:  Review of Systems  Constitutional: Denied constitutional symptoms, night sweats, recent illness, fatigue, fever, insomnia and weight loss.  Eyes: Denied eye symptoms, eye pain, photophobia, vision change and visual disturbance.  Ears/Nose/Throat/Neck: Denied ear, nose, throat or neck symptoms, hearing loss, nasal discharge, sinus congestion and sore throat.  Cardiovascular: Denied cardiovascular symptoms, arrhythmia, chest pain/pressure, edema, exercise intolerance, orthopnea and palpitations.  Respiratory: Denied pulmonary symptoms, asthma, pleuritic pain, productive sputum, cough, dyspnea and wheezing.  Gastrointestinal: Denied, gastro-esophageal reflux, melena, nausea and vomiting.  Genitourinary: Denied  genitourinary symptoms including symptomatic vaginal discharge, pelvic relaxation issues, and urinary complaints.  Musculoskeletal: Denied musculoskeletal symptoms, stiffness, swelling, muscle weakness and myalgia.  Dermatologic: Denied dermatology symptoms, rash and scar.  Neurologic: Denied neurology symptoms, dizziness, headache, neck pain and syncope.  Psychiatric: Denied psychiatric symptoms, anxiety and depression.  Endocrine: Denied endocrine symptoms including hot flashes and night sweats.   Meds:   Current Outpatient Medications on File Prior to Visit  Medication Sig Dispense Refill  . meclizine (ANTIVERT) 25 MG tablet Take 25 mg by mouth 3 (three) times daily as needed for dizziness.    . Prenatal Vit-Fe Fumarate-FA (MULTIVITAMIN-PRENATAL) 27-0.8 MG TABS tablet Take 1 tablet by mouth daily at 12 noon.     No current facility-administered medications on file prior to visit.    Objective:     Vitals:   10/24/19 1017  BP: 132/87  Pulse: 91              Urinary pregnancy test positive  Assessment:    G1P0 There are no problems to display for this patient.    1. Amenorrhea     Approximately 6 weeks estimated gestational age based on last menstrual period.   Plan:            Prenatal Plan 1.  The patient was given prenatal literature. 2.  She was continued on prenatal vitamins. 3.  A prenatal lab panel was ordered or drawn. 4.  An ultrasound was ordered to better determine an EDC. 5.  A nurse visit was scheduled. 6.  Genetic testing and testing for other inheritable conditions discussed in detail. She will decide in the future whether to have these  labs performed. 7.  A general overview of pregnancy testing, visit schedule, ultrasound schedule, and prenatal care was discussed. 8.  COVID and its risks associated with pregnancy, prevention by limiting exposure and use of masks, as well as the risks and benefits of vaccination during pregnancy were discussed in  detail.  [facility] policy regarding office and hospital visitation and testing was explained. 9.  Benefits of breast-feeding discussed in detail including both maternal and infant benefits. Ready Set Baby website discussed. 10.  Anesthesia consult for increased BMI discussed.  Will order approximately 16 weeks.   Orders Orders Placed This Encounter  Procedures  . US OB Comp Less 14 Wks    No orders of the defined types were placed in this encounter.     F/U  No follow-ups on file. I spent 32 minutes involved in the care of this patient preparing to see the patient by obtaining and reviewing her medical history (including labs, imaging tests and prior procedures), documenting clinical information in the electronic health record (EHR), counseling and coordinating care plans, writing and sending prescriptions, ordering tests or procedures and directly communicating with the patient by discussing pertinent items from her history and physical exam as well as detailing my assessment and plan as noted above so that she has an informed understanding.  All of her questions were answered.  Elonda Husky, M.D. 10/24/2019 10:35 AM

## 2019-11-02 ENCOUNTER — Other Ambulatory Visit: Payer: Self-pay

## 2019-11-02 ENCOUNTER — Ambulatory Visit (INDEPENDENT_AMBULATORY_CARE_PROVIDER_SITE_OTHER): Payer: 59

## 2019-11-02 DIAGNOSIS — N912 Amenorrhea, unspecified: Secondary | ICD-10-CM

## 2019-11-02 DIAGNOSIS — Z3A08 8 weeks gestation of pregnancy: Secondary | ICD-10-CM | POA: Diagnosis not present

## 2019-11-20 ENCOUNTER — Ambulatory Visit (INDEPENDENT_AMBULATORY_CARE_PROVIDER_SITE_OTHER): Payer: 59 | Admitting: Obstetrics and Gynecology

## 2019-11-20 ENCOUNTER — Other Ambulatory Visit: Payer: Self-pay

## 2019-11-20 VITALS — BP 119/75 | HR 89 | Ht 69.0 in | Wt 317.8 lb

## 2019-11-20 DIAGNOSIS — Z3491 Encounter for supervision of normal pregnancy, unspecified, first trimester: Secondary | ICD-10-CM

## 2019-11-20 NOTE — Progress Notes (Signed)
Nichole Carlson presents for NOB nurse interview visit. Pregnancy confirmation done 10/24/2019. G1. P-. Pregnancy education material explained and given. 0 cats in home. NOB labs ordered. TSH/HbgA1c ordered due to BMI 30 or greater. Sickle cell ordered due to patient's race. HIV labs and drug screen were explained and ordered. PNV encouraged. Genetic screening options discussed. Genetic testing: Ordered. Patient may discuss with the provider. Patient to follow up with provider in 1 weeks for NOB physical. All questions answered.

## 2019-11-21 LAB — CBC WITH DIFFERENTIAL/PLATELET
Basophils Absolute: 0 10*3/uL (ref 0.0–0.2)
Basos: 0 %
EOS (ABSOLUTE): 0.2 10*3/uL (ref 0.0–0.4)
Eos: 3 %
Hematocrit: 37.5 % (ref 34.0–46.6)
Hemoglobin: 12.4 g/dL (ref 11.1–15.9)
Immature Grans (Abs): 0.1 10*3/uL (ref 0.0–0.1)
Immature Granulocytes: 1 %
Lymphocytes Absolute: 1.7 10*3/uL (ref 0.7–3.1)
Lymphs: 19 %
MCH: 28.6 pg (ref 26.6–33.0)
MCHC: 33.1 g/dL (ref 31.5–35.7)
MCV: 87 fL (ref 79–97)
Monocytes Absolute: 0.7 10*3/uL (ref 0.1–0.9)
Monocytes: 8 %
Neutrophils Absolute: 6.2 10*3/uL (ref 1.4–7.0)
Neutrophils: 69 %
Platelets: 181 10*3/uL (ref 150–450)
RBC: 4.33 x10E6/uL (ref 3.77–5.28)
RDW: 13.1 % (ref 11.7–15.4)
WBC: 8.9 10*3/uL (ref 3.4–10.8)

## 2019-11-21 LAB — RPR: RPR Ser Ql: NONREACTIVE

## 2019-11-21 LAB — ANTIBODY SCREEN: Antibody Screen: NEGATIVE

## 2019-11-21 LAB — URINALYSIS, ROUTINE W REFLEX MICROSCOPIC
Bilirubin, UA: NEGATIVE
Glucose, UA: NEGATIVE
Ketones, UA: NEGATIVE
Leukocytes,UA: NEGATIVE
Nitrite, UA: NEGATIVE
Protein,UA: NEGATIVE
RBC, UA: NEGATIVE
Specific Gravity, UA: 1.02 (ref 1.005–1.030)
Urobilinogen, Ur: 0.2 mg/dL (ref 0.2–1.0)
pH, UA: 7.5 (ref 5.0–7.5)

## 2019-11-21 LAB — TOXOPLASMA ANTIBODIES- IGG AND  IGM
Toxoplasma Antibody- IgM: <3 AU/mL (ref 0.0–7.9)
Toxoplasma IgG Ratio: <3 IU/mL (ref 0.0–7.1)

## 2019-11-21 LAB — HGB SOLU + RFLX FRAC: Sickle Solubility Test - HGBRFX: NEGATIVE

## 2019-11-21 LAB — VARICELLA ZOSTER ANTIBODY, IGG: Varicella zoster IgG: 2280 index (ref >165)

## 2019-11-21 LAB — GC/CHLAMYDIA PROBE AMP
Chlamydia trachomatis, NAA: NEGATIVE
Neisseria Gonorrhoeae by PCR: NEGATIVE

## 2019-11-21 LAB — RUBELLA SCREEN: Rubella Antibodies, IGG: 19.8 index (ref >0.99)

## 2019-11-21 LAB — ABO AND RH: Rh Factor: POSITIVE

## 2019-11-21 LAB — HIV ANTIBODY (ROUTINE TESTING W REFLEX): HIV Screen 4th Generation wRfx: NONREACTIVE

## 2019-11-21 LAB — HEPATITIS B SURFACE ANTIGEN: Hepatitis B Surface Ag: NEGATIVE

## 2019-11-22 LAB — CULTURE, OB URINE

## 2019-11-22 LAB — TSH: TSH: 2.13 u[IU]/mL (ref 0.450–4.500)

## 2019-11-22 LAB — HEMOGLOBIN A1C
Est. average glucose Bld gHb Est-mCnc: 111 mg/dL
Hgb A1c MFr Bld: 5.5 % (ref 4.8–5.6)

## 2019-11-22 LAB — SPECIMEN STATUS REPORT

## 2019-11-22 LAB — URINE CULTURE, OB REFLEX

## 2019-11-24 LAB — MATERNIT21  PLUS CORE+ESS+SCA, BLOOD
11q23 deletion (Jacobsen): NOT DETECTED
15q11 deletion (PW Angelman): NOT DETECTED
1p36 deletion syndrome: NOT DETECTED
22q11 deletion (DiGeorge): NOT DETECTED
4p16 deletion(Wolf-Hirschhorn): NOT DETECTED
5p15 deletion (Cri-du-chat): NOT DETECTED
8q24 deletion (Langer-Giedion): NOT DETECTED
Fetal Fraction: 4
Monosomy X (Turner Syndrome): NOT DETECTED
Result (T21): NEGATIVE
Trisomy 13 (Patau syndrome): NEGATIVE
Trisomy 16: NOT DETECTED
Trisomy 18 (Edwards syndrome): NEGATIVE
Trisomy 21 (Down syndrome): NEGATIVE
Trisomy 22: NOT DETECTED
XXX (Triple X Syndrome): NOT DETECTED
XXY (Klinefelter Syndrome): NOT DETECTED
XYY (Jacobs Syndrome): NOT DETECTED

## 2019-11-27 LAB — MONITOR DRUG PROFILE 14(MW)
Amphetamine Scrn, Ur: NEGATIVE ng/mL
BARBITURATE SCREEN URINE: NEGATIVE ng/mL
BENZODIAZEPINE SCREEN, URINE: NEGATIVE ng/mL
Buprenorphine, Urine: NEGATIVE ng/mL
Cocaine (Metab) Scrn, Ur: NEGATIVE ng/mL
Creatinine(Crt), U: 104.6 mg/dL (ref 20.0–300.0)
Fentanyl, Urine: NEGATIVE pg/mL
Meperidine Screen, Urine: NEGATIVE ng/mL
Methadone Screen, Urine: NEGATIVE ng/mL
OXYCODONE+OXYMORPHONE UR QL SCN: NEGATIVE ng/mL
Opiate Scrn, Ur: NEGATIVE ng/mL
Ph of Urine: 7.4 (ref 4.5–8.9)
Phencyclidine Qn, Ur: NEGATIVE ng/mL
Propoxyphene Scrn, Ur: NEGATIVE ng/mL
SPECIFIC GRAVITY: 1.02
Tramadol Screen, Urine: NEGATIVE ng/mL

## 2019-11-27 LAB — NICOTINE SCREEN, URINE: Cotinine Ql Scrn, Ur: NEGATIVE ng/mL

## 2019-11-27 LAB — CANNABINOID (GC/MS), URINE
Cannabinoid: POSITIVE — AB
Carboxy THC (GC/MS): 13 ng/mL

## 2019-11-28 ENCOUNTER — Encounter: Payer: Self-pay | Admitting: Obstetrics and Gynecology

## 2019-11-28 ENCOUNTER — Other Ambulatory Visit: Payer: Self-pay

## 2019-11-28 ENCOUNTER — Ambulatory Visit (INDEPENDENT_AMBULATORY_CARE_PROVIDER_SITE_OTHER): Payer: 59 | Admitting: Obstetrics and Gynecology

## 2019-11-28 ENCOUNTER — Other Ambulatory Visit (HOSPITAL_COMMUNITY)
Admission: RE | Admit: 2019-11-28 | Discharge: 2019-11-28 | Disposition: A | Discharge status: Home / Self Care | Payer: 59 | Source: Ambulatory Visit | Attending: Obstetrics and Gynecology | Admitting: Obstetrics and Gynecology

## 2019-11-28 VITALS — BP 110/70 | HR 71 | Wt 319.4 lb

## 2019-11-28 DIAGNOSIS — Z3A12 12 weeks gestation of pregnancy: Secondary | ICD-10-CM | POA: Diagnosis not present

## 2019-11-28 DIAGNOSIS — Z6841 Body Mass Index (BMI) 40.0 and over, adult: Secondary | ICD-10-CM

## 2019-11-28 DIAGNOSIS — Z124 Encounter for screening for malignant neoplasm of cervix: Secondary | ICD-10-CM | POA: Insufficient documentation

## 2019-11-28 DIAGNOSIS — O0991 Supervision of high risk pregnancy, unspecified, first trimester: Secondary | ICD-10-CM

## 2019-11-28 LAB — POCT URINALYSIS DIPSTICK OB
Bilirubin, UA: NEGATIVE
Blood, UA: NEGATIVE
Glucose, UA: NEGATIVE
Ketones, UA: NEGATIVE
Leukocytes, UA: NEGATIVE
Nitrite, UA: NEGATIVE
POC,PROTEIN,UA: NEGATIVE
Spec Grav, UA: 1.015 (ref 1.010–1.025)
Urobilinogen, UA: 0.2 E.U./dL
pH, UA: 7 (ref 5.0–8.0)

## 2019-11-28 NOTE — Progress Notes (Signed)
NOB: AFP next visit patient to schedule early 1 hour GCT for 18 weeks.  Anesthesia consult ordered for increased BMI.  To begin 81 mg aspirin daily.  Early delivery discussed.  Patient wants gender reveal.  Pap performed today.  Physical examination General NAD, Conversant  HEENT Atraumatic; Op clear with mmm.  Normo-cephalic. Pupils reactive. Anicteric sclerae  Thyroid/Neck Smooth without nodularity or enlargement. Normal ROM.  Neck Supple.  Skin No rashes, lesions or ulceration. Normal palpated skin turgor. No nodularity.  Breasts: No masses or discharge.  Symmetric.  No axillary adenopathy.  Lungs: Clear to auscultation.No rales or wheezes. Normal Respiratory effort, no retractions.  Heart: NSR.  No murmurs or rubs appreciated. No periferal edema  Abdomen: Soft.  Non-tender.  No masses.  No HSM. No hernia  Extremities: Moves all appropriately.  Normal ROM for age. No lymphadenopathy.  Neuro: Oriented to PPT.  Normal mood. Normal affect.     Pelvic:   Vulva: Normal appearance.  No lesions.  Vagina: No lesions or abnormalities noted.  Support: Normal pelvic support.  Urethra No masses tenderness or scarring.  Meatus Normal size without lesions or prolapse.  Cervix: Normal appearance.  No lesions.  Anus: Normal exam.  No lesions.  Perineum: Normal exam.  No lesions.        Bimanual   Adnexae: No masses.  Non-tender to palpation.  Uterus: Enlarged. 12 weeks  Non-tender.  Mobile.  AV.  Adnexae: No masses.  Non-tender to palpation.  Cul-de-sac: Negative for abnormality.  Adnexae: No masses.  Non-tender to palpation.         Pelvimetry   Diagonal: Reached.  Spines: Average.  Sacrum: Concave.  Pubic Arch: Normal.

## 2019-11-28 NOTE — Addendum Note (Signed)
Addended by: Dorian Pod on: 11/28/2019 03:24 PM   Modules accepted: Orders

## 2019-11-30 LAB — CYTOLOGY - PAP: Diagnosis: NEGATIVE

## 2019-12-21 ENCOUNTER — Other Ambulatory Visit: Payer: Self-pay

## 2019-12-21 ENCOUNTER — Encounter: Payer: Self-pay | Admitting: Emergency Medicine

## 2019-12-21 ENCOUNTER — Emergency Department
Admission: EM | Admit: 2019-12-21 | Discharge: 2019-12-21 | Disposition: A | Discharge status: Home / Self Care | Payer: 59 | Attending: Student | Admitting: Student

## 2019-12-21 DIAGNOSIS — J069 Acute upper respiratory infection, unspecified: Secondary | ICD-10-CM | POA: Diagnosis not present

## 2019-12-21 DIAGNOSIS — R509 Fever, unspecified: Secondary | ICD-10-CM | POA: Diagnosis not present

## 2019-12-21 DIAGNOSIS — R52 Pain, unspecified: Secondary | ICD-10-CM | POA: Diagnosis not present

## 2019-12-21 DIAGNOSIS — J029 Acute pharyngitis, unspecified: Secondary | ICD-10-CM | POA: Diagnosis present

## 2019-12-21 DIAGNOSIS — Z91040 Latex allergy status: Secondary | ICD-10-CM | POA: Diagnosis not present

## 2019-12-21 DIAGNOSIS — R0981 Nasal congestion: Secondary | ICD-10-CM | POA: Diagnosis not present

## 2019-12-21 DIAGNOSIS — Z87891 Personal history of nicotine dependence: Secondary | ICD-10-CM | POA: Insufficient documentation

## 2019-12-21 DIAGNOSIS — R05 Cough: Secondary | ICD-10-CM | POA: Insufficient documentation

## 2019-12-21 DIAGNOSIS — F1210 Cannabis abuse, uncomplicated: Secondary | ICD-10-CM | POA: Insufficient documentation

## 2019-12-21 LAB — GROUP A STREP BY PCR: Group A Strep by PCR: NOT DETECTED

## 2019-12-21 NOTE — ED Provider Notes (Signed)
[facility] Provider Note  ____________________________________________   First MD Initiated Contact with Patient 12/21/19 [phone]     (approximate)  I have reviewed the triage vital signs and the nursing notes.   HISTORY  Chief Complaint Sore Throat   HPI Nichole Carlson is a 24 y.o. female presents to the ED with complaint of sore throat body aches for the last 24 hours.  Patient has felt like she has been running fever but has not taken it.  She complains also of cough and congestion.  She is unaware of any exposure to strep.  She denies any change in taste or smell other than her nose is stopped up.  She rates her throat pain as 7 out of 10.       Past Medical History:  Diagnosis Date  . UTI (lower urinary tract infection)   . Vertigo   . Yeast infection     There are no problems to display for this patient.   Past Surgical History:  Procedure Laterality Date  . TONSILLECTOMY    . TONSILLECTOMY    . wisdonm teeth removed      Prior to Admission medications   Medication Sig Start Date End Date Taking? Authorizing Provider  Prenatal Vit-Fe Fumarate-FA (MULTIVITAMIN-PRENATAL) 27-0.8 MG TABS tablet Take 1 tablet by mouth daily at 12 noon.    [provider]  Prenatal Vit-Fe Phos-FA-Omega (VITAFOL GUMMIES) 3.33-0.333-34.8 MG CHEW Chew by mouth.    [provider]    Allergies Fish allergy and Latex  No family history on file.  Social History Social History   Tobacco Use  . Smoking status: Former Smoker    Packs/day: 0.50    Types: Cigarettes  . Smokeless tobacco: Never Used  Substance Use Topics  . Alcohol use: Yes    Comment: occasional  . Drug use: Yes    Types: Marijuana    Comment: last smoked saturday morning    Review of Systems Constitutional: Objective fever/chills Eyes: No visual changes. ENT: Positive sore throat. Cardiovascular: Denies chest pain. Respiratory: Denies shortness  of breath. Gastrointestinal: No abdominal pain.  No nausea, no vomiting.  Musculoskeletal: Negative for muscle aches. Skin: Negative for rash. Neurological: Negative for headaches, focal weakness or numbness. ____________________________________________   PHYSICAL EXAM:  VITAL SIGNS: ED Triage Vitals  Enc Vitals Group     BP 12/21/19 0018 125/61     Pulse Rate 12/21/19 0018 90     Resp --      Temp 12/21/19 0018 98.8 F (37.1 C)     Temp Source 12/21/19 0018 Oral     SpO2 12/21/19 0018 100 %     Weight 12/21/19 0005 (!) 318 lb (144.2 kg)     Height 12/21/19 0005 5\' 9"  (1.753 m)     Head Circumference --      Peak Flow --      Pain Score 12/21/19 0005 7     Pain Loc --      Pain Edu? --      Excl. in GC? --     Constitutional: Alert and oriented. Well appearing and in no acute distress. Eyes: Conjunctivae are normal. PERRL. EOMI. Head: Atraumatic. Nose: No congestion/rhinnorhea. Mouth/Throat: Mucous membranes are moist.  Oropharynx mild erythema but no exudate was noted.  Uvula is midline. Neck: No stridor.   Hematological/Lymphatic/Immunilogical: No cervical lymphadenopathy. Cardiovascular: Normal rate, regular rhythm. Grossly normal heart sounds.  Good peripheral circulation. Respiratory: Normal respiratory effort.  No retractions. Lungs CTAB. Musculoskeletal: Moves upper and lower extremities with any difficulty normal gait was noted. Neurologic:  Normal speech and language. No gross focal neurologic deficits are appreciated. No gait instability. Skin:  Skin is warm, dry and intact. No rash noted. Psychiatric: Mood and affect are normal. Speech and behavior are normal.  ____________________________________________   LABS (all labs ordered are listed, but only abnormal results are displayed)  Labs Reviewed  GROUP A STREP BY PCR    PROCEDURES  Procedure(s) performed (including Critical  Care):  Procedures   ____________________________________________   INITIAL IMPRESSION / ASSESSMENT AND PLAN / ED COURSE  As part of my medical decision making, I reviewed the following data within the electronic MEDICAL RECORD NUMBER Notes from prior ED visits and [place] Controlled Substance Database  Nichole Carlson was evaluated in Emergency Department on 12/21/2019 for the symptoms described in the history of present illness. She was evaluated in the context of the global COVID-19 pandemic, which necessitated consideration that the patient might be at risk for infection with the SARS-CoV-2 virus that causes COVID-19. Institutional protocols and algorithms that pertain to the evaluation of patients at risk for COVID-19 are in a state of rapid change based on information released by regulatory bodies including the CDC and federal and state organizations. These policies and algorithms were followed during the patient's care in the ED.  24 year old female presents to the ED with complaint of body aches and sore throat for the last 24 hours.  Patient states she also has cough and congestion with this.  She denies any known exposure to Covid and continues to eat and drink the same.  Patient was positive for nasal congestion with an occasional cough.  No exudate was noted on throat exam and minimal to no erythema present.  Uvula was midline.  Strep test was negative and reassuring for the patient.  We discussed increasing fluids and taking Tylenol sparingly.  She may also use saline nasal spray as needed for nasal congestion.  She is to call [facility] for any further medication. ____________________________________________   FINAL CLINICAL IMPRESSION(S) / ED DIAGNOSES  Final diagnoses:  Viral URI with cough     ED Discharge Orders    None       Note:  This document was prepared using Dragon voice recognition software and may include unintentional dictation errors.    Johnn Hai, PA-C 12/21/19 3235    Lilia Pro., MD 12/22/19 2015

## 2019-12-21 NOTE — ED Triage Notes (Signed)
Patient ambulatory to triage with steady gait, without difficulty or distress noted, mask in place; pt reports sore throat since this am accomp by cough/congestion

## 2019-12-21 NOTE — ED Notes (Signed)
Pt c/o cough congestion sore throat with body aches for the past 24 hrs.

## 2019-12-21 NOTE — Discharge Instructions (Signed)
Call your OB/GYN at [facility] to see what you can take while being pregnant for cough and congestion.  You may use saline nose spray for nasal congestion and drink lots of fluids.  You can sparingly take Tylenol for throat pain, body aches and also fever.

## 2019-12-27 ENCOUNTER — Encounter: Payer: Self-pay | Admitting: Obstetrics and Gynecology

## 2019-12-27 ENCOUNTER — Ambulatory Visit (INDEPENDENT_AMBULATORY_CARE_PROVIDER_SITE_OTHER): Payer: 59 | Admitting: Obstetrics and Gynecology

## 2019-12-27 ENCOUNTER — Other Ambulatory Visit: Payer: Self-pay

## 2019-12-27 ENCOUNTER — Other Ambulatory Visit: Payer: 59

## 2019-12-27 VITALS — BP 124/68 | HR 88 | Wt 312.7 lb

## 2019-12-27 DIAGNOSIS — Z3A16 16 weeks gestation of pregnancy: Secondary | ICD-10-CM

## 2019-12-27 DIAGNOSIS — Z6841 Body Mass Index (BMI) 40.0 and over, adult: Secondary | ICD-10-CM

## 2019-12-27 DIAGNOSIS — O0992 Supervision of high risk pregnancy, unspecified, second trimester: Secondary | ICD-10-CM

## 2019-12-27 LAB — POCT URINALYSIS DIPSTICK OB
Bilirubin, UA: NEGATIVE
Blood, UA: NEGATIVE
Glucose, UA: NEGATIVE
Ketones, UA: NEGATIVE
Leukocytes, UA: NEGATIVE
Nitrite, UA: NEGATIVE
POC,PROTEIN,UA: NEGATIVE
Spec Grav, UA: >=1.03 — AB (ref 1.010–1.025)
Urobilinogen, UA: NEGATIVE E.U./dL — AB
pH, UA: 6 (ref 5.0–8.0)

## 2019-12-27 MED ORDER — ASPIRIN EC 81 MG PO TBEC: 81.0000 mg | DELAYED_RELEASE_TABLET | Freq: Every day | ORAL | 2 refills | Status: DC

## 2019-12-27 NOTE — Progress Notes (Signed)
ROB-Pt is present for routine prenatal care.  Pt complains of urinary pressure.

## 2019-12-27 NOTE — Progress Notes (Signed)
ROB: Patient notes some pressure with urination but denies dysuria or hematuria.  UA with no evidence of UTI.  Normal MaterniT21, female infant. Reiterated need for early glucola. Has initiated aspirin. RTC in 2 weeks for labs (early glucola, TSH, and AFP) and 4 weeks for ROB visit and anatomy scan.

## 2020-01-16 ENCOUNTER — Other Ambulatory Visit: Payer: 59

## 2020-01-23 ENCOUNTER — Ambulatory Visit (INDEPENDENT_AMBULATORY_CARE_PROVIDER_SITE_OTHER): Payer: Self-pay

## 2020-01-23 ENCOUNTER — Ambulatory Visit (INDEPENDENT_AMBULATORY_CARE_PROVIDER_SITE_OTHER): Payer: Self-pay | Admitting: Obstetrics and Gynecology

## 2020-01-23 ENCOUNTER — Encounter: Payer: Self-pay | Admitting: Obstetrics and Gynecology

## 2020-01-23 VITALS — BP 100/52 | HR 78 | Wt 320.0 lb

## 2020-01-23 DIAGNOSIS — O0992 Supervision of high risk pregnancy, unspecified, second trimester: Secondary | ICD-10-CM

## 2020-01-23 DIAGNOSIS — Z6841 Body Mass Index (BMI) 40.0 and over, adult: Secondary | ICD-10-CM

## 2020-01-23 DIAGNOSIS — Z3A20 20 weeks gestation of pregnancy: Secondary | ICD-10-CM

## 2020-01-23 LAB — POCT URINALYSIS DIPSTICK OB
Bilirubin, UA: NEGATIVE
Blood, UA: NEGATIVE
Glucose, UA: NEGATIVE
Ketones, UA: NEGATIVE
Leukocytes, UA: NEGATIVE
Nitrite, UA: NEGATIVE
POC,PROTEIN,UA: NEGATIVE
Spec Grav, UA: 1.015 (ref 1.010–1.025)
Urobilinogen, UA: 0.2 E.U./dL
pH, UA: 7 (ref 5.0–8.0)

## 2020-01-23 NOTE — Progress Notes (Signed)
ROB:  FAS today - incomplete.  Reports daily movement.  She missed her appointment for early 1 hour GCT and AFP.  Anesthesia consult reordered and discussed with patient.  AFP, TSH, hemoglobin A1c drawn today.  If A1c abnormal perform 1 hour GCT.  If A1c normal then consider 1 hour GCT at 26 to 28 weeks.

## 2020-01-25 LAB — AFP, SERUM, OPEN SPINA BIFIDA
AFP MoM: 1.71
AFP Value: 64.8 ng/mL
Gest. Age on Collection Date: 20 weeks
Maternal Age At EDD: 26.8 yr
OSBR Risk 1 IN: 1590
Test Results:: NEGATIVE
Weight: 320 [lb_av]

## 2020-01-28 DIAGNOSIS — Z419 Encounter for procedure for purposes other than remedying health state, unspecified: Secondary | ICD-10-CM | POA: Diagnosis not present

## 2020-01-30 ENCOUNTER — Telehealth: Payer: Self-pay | Admitting: Obstetrics and Gynecology

## 2020-01-30 NOTE — Telephone Encounter (Signed)
This patient tested positive for COVID-19 today 01/30/2020. Patient wants to know what she can do since she's pregnant and has now been diagnosed with COVID-19. Patient would like a phone call from the nurse to go over her concerns. Could you please advise?

## 2020-01-30 NOTE — Telephone Encounter (Addendum)
Spoke with patient and told her that I would send her information through My chart about Covid. Sent patient mychart message with dot phrase that Dr. Logan Bores had made for pregnant Covid patients.  I let her know if she needs anything to let me know.

## 2020-02-06 ENCOUNTER — Other Ambulatory Visit: Payer: Self-pay

## 2020-02-15 ENCOUNTER — Other Ambulatory Visit: Payer: Self-pay | Admitting: Obstetrics and Gynecology

## 2020-02-15 DIAGNOSIS — Z09 Encounter for follow-up examination after completed treatment for conditions other than malignant neoplasm: Secondary | ICD-10-CM

## 2020-02-19 NOTE — Patient Instructions (Signed)
WHAT OB PATIENTS CAN EXPECT   Confirmation of pregnancy and ultrasound ordered if medically indicated-[redacted] weeks gestation  New OB (NOB) intake with nurse and New OB (NOB) labs- [redacted] weeks gestation  New OB (NOB) physical examination with provider- 11/[redacted] weeks gestation  Flu vaccine-[redacted] weeks gestation  Anatomy scan-[redacted] weeks gestation  Glucose tolerance test, blood work to test for anemia, T-dap vaccine-[redacted] weeks gestation  Vaginal swabs/cultures-STD/Group B strep-[redacted] weeks gestation  Appointments every 4 weeks until 28 weeks  Every 2 weeks from 28 weeks until 36 weeks  Weekly visits from 36 weeks until delivery  Second Trimester of Pregnancy  The second trimester is from week 14 through week 27 (month 4 through 6). This is often the time in pregnancy that you feel your best. Often times, morning sickness has lessened or quit. You may have more energy, and you may get hungry more often. Your unborn baby is growing rapidly. At the end of the sixth month, he or she is about 9 inches long and weighs about 1 pounds. You will likely feel the baby move between 18 and 20 weeks of pregnancy. Follow these instructions at home: Medicines  Take over-the-counter and prescription medicines only as told by your doctor. Some medicines are safe and some medicines are not safe during pregnancy.  Take a prenatal vitamin that contains at least 600 micrograms (mcg) of folic acid.  If you have trouble pooping (constipation), take medicine that will make your stool soft (stool softener) if your doctor approves. Eating and drinking   Eat regular, healthy meals.  Avoid raw meat and uncooked cheese.  If you get low calcium from the food you eat, talk to your doctor about taking a daily calcium supplement.  Avoid foods that are high in fat and sugars, such as fried and sweet foods.  If you feel sick to your stomach (nauseous) or throw up (vomit): ? Eat 4 or 5 small meals a day instead of 3 large  meals. ? Try eating a few soda crackers. ? Drink liquids between meals instead of during meals.  To prevent constipation: ? Eat foods that are high in fiber, like fresh fruits and vegetables, whole grains, and beans. ? Drink enough fluids to keep your pee (urine) clear or pale yellow. Activity  Exercise only as told by your doctor. Stop exercising if you start to have cramps.  Do not exercise if it is too hot, too humid, or if you are in a place of great height (high altitude).  Avoid heavy lifting.  Wear low-heeled shoes. Sit and stand up straight.  You can continue to have sex unless your doctor tells you not to. Relieving pain and discomfort  Wear a good support bra if your breasts are tender.  Take warm water baths (sitz baths) to soothe pain or discomfort caused by hemorrhoids. Use hemorrhoid cream if your doctor approves.  Rest with your legs raised if you have leg cramps or low back pain.  If you develop puffy, bulging veins (varicose veins) in your legs: ? Wear support hose or compression stockings as told by your doctor. ? Raise (elevate) your feet for 15 minutes, 3-4 times a day. ? Limit salt in your food. Prenatal care  Write down your questions. Take them to your prenatal visits.  Keep all your prenatal visits as told by your doctor. This is important. Safety  Wear your seat belt when driving.  Make a list of emergency phone numbers, including numbers for family, friends, the   hospital, and police and fire departments. General instructions  Ask your doctor about the right foods to eat or for help finding a counselor, if you need these services.  Ask your doctor about local prenatal classes. Begin classes before month 6 of your pregnancy.  Do not use hot tubs, steam rooms, or saunas.  Do not douche or use tampons or scented sanitary pads.  Do not cross your legs for long periods of time.  Visit your dentist if you have not done so. Use a soft toothbrush  to brush your teeth. Floss gently.  Avoid all smoking, herbs, and alcohol. Avoid drugs that are not approved by your doctor.  Do not use any products that contain nicotine or tobacco, such as cigarettes and e-cigarettes. If you need help quitting, ask your doctor.  Avoid cat litter boxes and soil used by cats. These carry germs that can cause birth defects in the baby and can cause a loss of your baby (miscarriage) or stillbirth. Contact a doctor if:  You have mild cramps or pressure in your lower belly.  You have pain when you pee (urinate).  You have bad smelling fluid coming from your vagina.  You continue to feel sick to your stomach (nauseous), throw up (vomit), or have watery poop (diarrhea).  You have a nagging pain in your belly area.  You feel dizzy. Get help right away if:  You have a fever.  You are leaking fluid from your vagina.  You have spotting or bleeding from your vagina.  You have severe belly cramping or pain.  You lose or gain weight rapidly.  You have trouble catching your breath and have chest pain.  You notice sudden or extreme puffiness (swelling) of your face, hands, ankles, feet, or legs.  You have not felt the baby move in over an hour.  You have severe headaches that do not go away when you take medicine.  You have trouble seeing. Summary  The second trimester is from week 14 through week 27 (months 4 through 6). This is often the time in pregnancy that you feel your best.  To take care of yourself and your unborn baby, you will need to eat healthy meals, take medicines only if your doctor tells you to do so, and do activities that are safe for you and your baby.  Call your doctor if you get sick or if you notice anything unusual about your pregnancy. Also, call your doctor if you need help with the right food to eat, or if you want to know what activities are safe for you. This information is not intended to replace advice given to you by  your health care provider. Make sure you discuss any questions you have with your health care provider. Document Revised: 10/07/2018 Document Reviewed: 07/21/2016 Elsevier Patient Education  2020 [employer]. Breastfeeding  Choosing to breastfeed is one of the best decisions you can make for yourself and your baby. A change in hormones during pregnancy causes your breasts to make breast milk in your milk-producing glands. Hormones prevent breast milk from being released before your baby is born. They also prompt milk flow after birth. Once breastfeeding has begun, thoughts of your baby, as well as his or her sucking or crying, can stimulate the release of milk from your milk-producing glands. Benefits of breastfeeding Research shows that breastfeeding offers many health benefits for infants and mothers. It also offers a cost-free and convenient way to feed your baby. For your baby  Your first milk (colostrum) helps your baby's digestive system to function better.  Special cells in your milk (antibodies) help your baby to fight off infections.  Breastfed babies are less likely to develop asthma, allergies, obesity, or type 2 diabetes. They are also at lower risk for sudden infant death syndrome (SIDS).  Nutrients in breast milk are better able to meet your babys needs compared to infant formula.  Breast milk improves your baby's brain development. For you  Breastfeeding helps to create a very special bond between you and your baby.  Breastfeeding is convenient. Breast milk costs nothing and is always available at the correct temperature.  Breastfeeding helps to burn calories. It helps you to lose the weight that you gained during pregnancy.  Breastfeeding makes your uterus return faster to its size before pregnancy. It also slows bleeding (lochia) after you give birth.  Breastfeeding helps to lower your risk of developing type 2 diabetes, osteoporosis, rheumatoid arthritis,  cardiovascular disease, and breast, ovarian, uterine, and endometrial cancer later in life. Breastfeeding basics Starting breastfeeding  Find a comfortable place to sit or lie down, with your neck and back well-supported.  Place a pillow or a rolled-up blanket under your baby to bring him or her to the level of your breast (if you are seated). Nursing pillows are specially designed to help support your arms and your baby while you breastfeed.  Make sure that your baby's tummy (abdomen) is facing your abdomen.  Gently massage your breast. With your fingertips, massage from the outer edges of your breast inward toward the nipple. This encourages milk flow. If your milk flows slowly, you may need to continue this action during the feeding.  Support your breast with 4 fingers underneath and your thumb above your nipple (make the letter "C" with your hand). Make sure your fingers are well away from your nipple and your babys mouth.  Stroke your baby's lips gently with your finger or nipple.  When your baby's mouth is open wide enough, quickly bring your baby to your breast, placing your entire nipple and as much of the areola as possible into your baby's mouth. The areola is the colored area around your nipple. ? More areola should be visible above your baby's upper lip than below the lower lip. ? Your baby's lips should be opened and extended outward (flanged) to ensure an adequate, comfortable latch. ? Your baby's tongue should be between his or her lower gum and your breast.  Make sure that your baby's mouth is correctly positioned around your nipple (latched). Your baby's lips should create a seal on your breast and be turned out (everted).  It is common for your baby to suck about 2-3 minutes in order to start the flow of breast milk. Latching Teaching your baby how to latch onto your breast properly is very important. An improper latch can cause nipple pain, decreased milk supply, and poor  weight gain in your baby. Also, if your baby is not latched onto your nipple properly, he or she may swallow some air during feeding. This can make your baby fussy. Burping your baby when you switch breasts during the feeding can help to get rid of the air. However, teaching your baby to latch on properly is still the best way to prevent fussiness from swallowing air while breastfeeding. Signs that your baby has successfully latched onto your nipple  Silent tugging or silent sucking, without causing you pain. Infant's lips should be extended outward (flanged).  Swallowing heard between every 3-4 sucks once your milk has started to flow (after your let-down milk reflex occurs).  Muscle movement above and in front of his or her ears while sucking. Signs that your baby has not successfully latched onto your nipple  Sucking sounds or smacking sounds from your baby while breastfeeding.  Nipple pain. If you think your baby has not latched on correctly, slip your finger into the corner of your babys mouth to break the suction and place it between your baby's gums. Attempt to start breastfeeding again. Signs of successful breastfeeding Signs from your baby  Your baby will gradually decrease the number of sucks or will completely stop sucking.  Your baby will fall asleep.  Your baby's body will relax.  Your baby will retain a small amount of milk in his or her mouth.  Your baby will let go of your breast by himself or herself. Signs from you  Breasts that have increased in firmness, weight, and size 1-3 hours after feeding.  Breasts that are softer immediately after breastfeeding.  Increased milk volume, as well as a change in milk consistency and color by the fifth day of breastfeeding.  Nipples that are not sore, cracked, or bleeding. Signs that your baby is getting enough milk  Wetting at least 1-2 diapers during the first 24 hours after birth.  Wetting at least 5-6 diapers every 24  hours for the first week after birth. The urine should be clear or pale yellow by the age of 5 days.  Wetting 6-8 diapers every 24 hours as your baby continues to grow and develop.  At least 3 stools in a 24-hour period by the age of 5 days. The stool should be soft and yellow.  At least 3 stools in a 24-hour period by the age of 7 days. The stool should be seedy and yellow.  No loss of weight greater than 10% of birth weight during the first 3 days of life.  Average weight gain of 4-7 oz (113-198 g) per week after the age of 4 days.  Consistent daily weight gain by the age of 5 days, without weight loss after the age of 2 weeks. After a feeding, your baby may spit up a small amount of milk. This is normal. Breastfeeding frequency and duration Frequent feeding will help you make more milk and can prevent sore nipples and extremely full breasts (breast engorgement). Breastfeed when you feel the need to reduce the fullness of your breasts or when your baby shows signs of hunger. This is called "breastfeeding on demand." Signs that your baby is hungry include:  Increased alertness, activity, or restlessness.  Movement of the head from side to side.  Opening of the mouth when the corner of the mouth or [place] is stroked (rooting).  Increased sucking sounds, smacking lips, cooing, sighing, or squeaking.  Hand-to-mouth movements and sucking on fingers or hands.  Fussing or crying. Avoid introducing a pacifier to your baby in the first 4-6 weeks after your baby is born. After this time, you may choose to use a pacifier. Research has shown that pacifier use during the first year of a baby's life decreases the risk of sudden infant death syndrome (SIDS). Allow your baby to feed on each breast as long as he or she wants. When your baby unlatches or falls asleep while feeding from the first breast, offer the second breast. Because newborns are often sleepy in the first few weeks of life, you may  need to awaken your baby to get him or her to feed. Breastfeeding times will vary from baby to baby. However, the following rules can serve as a guide to help you make sure that your baby is properly fed:  Newborns (babies 18 weeks of age or younger) may breastfeed every 1-3 hours.  Newborns should not go without breastfeeding for longer than 3 hours during the day or 5 hours during the night.  You should breastfeed your baby a minimum of 8 times in a 24-hour period. Breast milk pumping     Pumping and storing breast milk allows you to make sure that your baby is exclusively fed your breast milk, even at times when you are unable to breastfeed. This is especially important if you go back to work while you are still breastfeeding, or if you are not able to be present during feedings. Your lactation consultant can help you find a method of pumping that works best for you and give you guidelines about how long it is safe to store breast milk. Caring for your breasts while you breastfeed Nipples can become dry, cracked, and sore while breastfeeding. The following recommendations can help keep your breasts moisturized and healthy:  Avoid using soap on your nipples.  Wear a supportive bra designed especially for nursing. Avoid wearing underwire-style bras or extremely tight bras (sports bras).  Air-dry your nipples for 3-4 minutes after each feeding.  Use only cotton bra pads to absorb leaked breast milk. Leaking of breast milk between feedings is normal.  Use lanolin on your nipples after breastfeeding. Lanolin helps to maintain your skin's normal moisture barrier. Pure lanolin is not harmful (not toxic) to your baby. You may also hand express a few drops of breast milk and gently massage that milk into your nipples and allow the milk to air-dry. In the first few weeks after giving birth, some women experience breast engorgement. Engorgement can make your breasts feel heavy, warm, and tender to  the touch. Engorgement peaks within 3-5 days after you give birth. The following recommendations can help to ease engorgement:  Completely empty your breasts while breastfeeding or pumping. You may want to start by applying warm, moist heat (in the shower or with warm, water-soaked hand towels) just before feeding or pumping. This increases circulation and helps the milk flow. If your baby does not completely empty your breasts while breastfeeding, pump any extra milk after he or she is finished.  Apply ice packs to your breasts immediately after breastfeeding or pumping, unless this is too uncomfortable for you. To do this: ? Put ice in a plastic bag. ? Place a towel between your skin and the bag. ? Leave the ice on for 20 minutes, 2-3 times a day.  Make sure that your baby is latched on and positioned properly while breastfeeding. If engorgement persists after 48 hours of following these recommendations, contact your health care provider or a Science writer. Overall health care recommendations while breastfeeding  Eat 3 healthy meals and 3 snacks every day. Well-nourished mothers who are breastfeeding need an additional 450-500 calories a day. You can meet this requirement by increasing the amount of a balanced diet that you eat.  Drink enough water to keep your urine pale yellow or clear.  Rest often, relax, and continue to take your prenatal vitamins to prevent fatigue, stress, and low vitamin and mineral levels in your body (nutrient deficiencies).  Do not use any products that contain nicotine or tobacco, such as  cigarettes and e-cigarettes. Your baby may be harmed by chemicals from cigarettes that pass into breast milk and exposure to secondhand smoke. If you need help quitting, ask your health care provider.  Avoid alcohol.  Do not use illegal drugs or marijuana.  Talk with your health care provider before taking any medicines. These include over-the-counter and prescription  medicines as well as vitamins and herbal supplements. Some medicines that may be harmful to your baby can pass through breast milk.  It is possible to become pregnant while breastfeeding. If birth control is desired, ask your health care provider about options that will be safe while breastfeeding your baby. Where to find more information: [employer]: www.llli.org Contact a health care provider if:  You feel like you want to stop breastfeeding or have become frustrated with breastfeeding.  Your nipples are cracked or bleeding.  Your breasts are red, tender, or warm.  You have: ? Painful breasts or nipples. ? A swollen area on either breast. ? A fever or chills. ? Nausea or vomiting. ? Drainage other than breast milk from your nipples.  Your breasts do not become full before feedings by the fifth day after you give birth.  You feel sad and depressed.  Your baby is: ? Too sleepy to eat well. ? Having trouble sleeping. ? More than 21 week old and wetting fewer than 6 diapers in a 24-hour period. ? Not gaining weight by 88 days of age.  Your baby has fewer than 3 stools in a 24-hour period.  Your baby's skin or the white parts of his or her eyes become yellow. Get help right away if:  Your baby is overly tired (lethargic) and does not want to wake up and feed.  Your baby develops an unexplained fever. Summary  Breastfeeding offers many health benefits for infant and mothers.  Try to breastfeed your infant when he or she shows early signs of hunger.  Gently tickle or stroke your baby's lips with your finger or nipple to allow the baby to open his or her mouth. Bring the baby to your breast. Make sure that much of the areola is in your baby's mouth. Offer one side and burp the baby before you offer the other side.  Talk with your health care provider or lactation consultant if you have questions or you face problems as you breastfeed. This information is not  intended to replace advice given to you by your health care provider. Make sure you discuss any questions you have with your health care provider. Document Revised: 09/09/2017 Document Reviewed: 07/17/2016 Elsevier Patient Education  [place]. Commonly Asked Questions During Pregnancy  Cats: A parasite can be excreted in cat feces.  To avoid exposure you need to have another person empty the little box.  If you must empty the litter box you will need to wear gloves.  Wash your hands after handling your cat.  This parasite can also be found in raw or undercooked meat so this should also be avoided.  Colds, Sore Throats, Flu: Please check your medication sheet to see what you can take for symptoms.  If your symptoms are unrelieved by these medications please call the office.  Dental Work: Most any dental work Investment banker, corporate recommends is permitted.  X-rays should only be taken during the first trimester if absolutely necessary.  Your abdomen should be shielded with a lead apron during all x-rays.  Please notify your provider prior to receiving any x-rays.  Novocaine is fine; gas is not recommended.  If your dentist requires a note from [place] prior to dental work please call the office and we will provide one for you.  Exercise: Exercise is an important part of staying healthy during your pregnancy.  You may continue most exercises you were accustomed to prior to pregnancy.  Later in your pregnancy you will most likely notice you have difficulty with activities requiring balance like riding a bicycle.  It is important that you listen to your body and avoid activities that put you at a higher risk of falling.  Adequate rest and staying well hydrated are a must!  If you have questions about the safety of specific activities ask your provider.    Exposure to Children with illness: Try to avoid obvious exposure; report any symptoms to [place] when noted,  If you have chicken pos, red measles or mumps, you  should be immune to these diseases.   Please do not take any vaccines while pregnant unless you have checked with your OB provider.  Fetal Movement: After 28 weeks we recommend you do "kick counts" twice daily.  Lie or sit down in a calm quiet environment and count your baby movements "kicks".  You should feel your baby at least 10 times per hour.  If you have not felt 10 kicks within the first hour get up, walk around and have something sweet to eat or drink then repeat for an additional hour.  If count remains less than 10 per hour notify your provider.  Fumigating: Follow your pest control agent's advice as to how long to stay out of your home.  Ventilate the area well before re-entering.  Hemorrhoids:   Most over-the-counter preparations can be used during pregnancy.  Check your medication to see what is safe to use.  It is important to use a stool softener or fiber in your diet and to drink lots of liquids.  If hemorrhoids seem to be getting worse please call the office.   Hot Tubs:  Hot tubs Jacuzzis and saunas are not recommended while pregnant.  These increase your internal body temperature and should be avoided.  Intercourse:  Sexual intercourse is safe during pregnancy as long as you are comfortable, unless otherwise advised by your provider.  Spotting may occur after intercourse; report any bright red bleeding that is heavier than spotting.  Labor:  If you know that you are in labor, please go to the hospital.  If you are unsure, please call the office and let us help you decide what to do.  Lifting, straining, etc:  If your job requires heavy lifting or straining please check with your provider for any limitations.  Generally, you should not lift items heavier than that you can lift simply with your hands and arms (no back muscles)  Painting:  Paint fumes do not harm your pregnancy, but may make you ill and should be avoided if possible.  Latex or water based paints have less odor than  oils.  Use adequate ventilation while painting.  Permanents & Hair Color:  Chemicals in hair dyes are not recommended as they cause increase hair dryness which can increase hair loss during pregnancy.  " Highlighting" and permanents are allowed.  Dye may be absorbed differently and permanents may not hold as well during pregnancy.  Sunbathing:  Use a sunscreen, as skin burns easily during pregnancy.  Drink plenty of fluids; avoid over heating.  Tanning Beds:  Because their possible side effects  are still unknown, tanning beds are not recommended.  Ultrasound Scans:  Routine ultrasounds are performed at approximately 20 weeks.  You will be able to see your baby's general anatomy an if you would like to know the gender this can usually be determined as well.  If it is questionable when you conceived you may also receive an ultrasound early in your pregnancy for dating purposes.  Otherwise ultrasound exams are not routinely performed unless there is a medical necessity.  Although you can request a scan we ask that you pay for it when conducted because insurance does not cover " patient request" scans.  Work: If your pregnancy proceeds without complications you may work until your due date, unless your physician or employer advises otherwise.  Round Ligament Pain/Pelvic Discomfort:  Sharp, shooting pains not associated with bleeding are fairly common, usually occurring in the second trimester of pregnancy.  They tend to be worse when standing up or when you remain standing for long periods of time.  These are the result of pressure of certain pelvic ligaments called "round ligaments".  Rest, Tylenol and heat seem to be the most effective relief.  As the womb and fetus grow, they rise out of the pelvis and the discomfort improves.  Please notify the office if your pain seems different than that described.  It may represent a more serious condition.  Common Medications Safe in  Pregnancy  Acne:      Constipation:  Benzoyl Peroxide     Colace  Clindamycin      Dulcolax Suppository  Topica Erythromycin     Fibercon  Salicylic Acid      Metamucil         Miralax AVOID:        Senakot   Accutane    Cough:  Retin-A       Cough Drops  Tetracycline      Phenergan w/ Codeine if Rx  Minocycline      Robitussin (Plain & DM)  Antibiotics:     Crabs/Lice:  Ceclor       RID  Cephalosporins    AVOID:  E-Mycins      Kwell  Keflex  Macrobid/Macrodantin   Diarrhea:  Penicillin      Kao-Pectate  Zithromax      Imodium AD         PUSH FLUIDS AVOID:       Cipro     Fever:  Tetracycline      Tylenol (Regular or Extra  Minocycline       Strength)  Levaquin      Extra Strength-Do not          Exceed 8 tabs/24 hrs Caffeine:        <253m/day (equiv. To 1 cup of coffee or  approx. 3 12 oz sodas)         Gas: Cold/Hayfever:       Gas-X  Benadryl      Mylicon  Claritin       Phazyme  **Claritin-D        Chlor-Trimeton    Headaches:  Dimetapp      ASA-Free Excedrin  Drixoral-Non-Drowsy     Cold Compress  Mucinex (Guaifenasin)     Tylenol (Regular or Extra  Sudafed/Sudafed-12 Hour     Strength)  **Sudafed PE Pseudoephedrine   Tylenol Cold & Sinus     Vicks Vapor Rub  Zyrtec  **AVOID if Problems With Blood Pressure  Heartburn: Avoid lying down for at least 1 hour after meals  Aciphex      Maalox     Rash:  Milk of Magnesia     Benadryl    Mylanta       1% Hydrocortisone Cream  Pepcid  Pepcid Complete   Sleep Aids:  Prevacid      Ambien   Prilosec       Benadryl  Rolaids       Chamomile Tea  Tums (Limit 4/day)     Unisom         Tylenol PM         Warm milk-add vanilla or  Hemorrhoids:       Sugar for taste  Anusol/Anusol H.C.  (RX: Analapram 2.5%)  Sugar Substitutes:  Hydrocortisone OTC     Ok in moderation  Preparation H      Tucks        Vaseline lotion applied to tissue with  wiping    Herpes:     Throat:  Acyclovir      Oragel  Famvir  Valtrex     Vaccines:         Flu Shot Leg Cramps:       *Gardasil  Benadryl      Hepatitis A         Hepatitis B Nasal Spray:       Pneumovax  Saline Nasal Spray     Polio Booster         Tetanus Nausea:       Tuberculosis test or PPD  Vitamin B6 25 mg TID   AVOID:    Dramamine      *Gardasil  Emetrol       Live Poliovirus  Ginger Root 250 mg QID    MMR (measles, mumps &  High Complex Carbs @ Bedtime    rebella)  Sea Bands-Accupressure    Varicella (Chickenpox)  Unisom 1/2 tab TID     *No known complications           If received before Pain:         Known pregnancy;   Darvocet       Resume series after  Lortab        Delivery  Percocet    Yeast:   Tramadol      Femstat  Tylenol 3      Gyne-lotrimin  Ultram       Monistat  Vicodin           MISC:         All Sunscreens           Hair Coloring/highlights          Insect Repellant's          (Including DEET)         Mystic Tans

## 2020-02-19 NOTE — Progress Notes (Signed)
ROB-Pt present for routine prenatal care. Pt stated having hip/pelvic pain.

## 2020-02-20 ENCOUNTER — Other Ambulatory Visit: Payer: Self-pay

## 2020-02-20 ENCOUNTER — Other Ambulatory Visit: Payer: Medicaid Other

## 2020-02-20 ENCOUNTER — Ambulatory Visit (INDEPENDENT_AMBULATORY_CARE_PROVIDER_SITE_OTHER): Payer: Medicaid Other | Admitting: Obstetrics and Gynecology

## 2020-02-20 ENCOUNTER — Ambulatory Visit (INDEPENDENT_AMBULATORY_CARE_PROVIDER_SITE_OTHER): Payer: Medicaid Other

## 2020-02-20 ENCOUNTER — Encounter: Payer: Self-pay | Admitting: Obstetrics and Gynecology

## 2020-02-20 VITALS — BP 107/69 | HR 83 | Wt 317.0 lb

## 2020-02-20 DIAGNOSIS — Z3A24 24 weeks gestation of pregnancy: Secondary | ICD-10-CM

## 2020-02-20 DIAGNOSIS — Z09 Encounter for follow-up examination after completed treatment for conditions other than malignant neoplasm: Secondary | ICD-10-CM | POA: Diagnosis not present

## 2020-02-20 DIAGNOSIS — Z3402 Encounter for supervision of normal first pregnancy, second trimester: Secondary | ICD-10-CM

## 2020-02-20 LAB — POCT URINALYSIS DIPSTICK OB
Bilirubin, UA: NEGATIVE
Blood, UA: NEGATIVE
Glucose, UA: NEGATIVE
Leukocytes, UA: NEGATIVE
Nitrite, UA: NEGATIVE
POC,PROTEIN,UA: NEGATIVE
Spec Grav, UA: 1.02 (ref 1.010–1.025)
Urobilinogen, UA: 0.2 E.U./dL
pH, UA: 6 (ref 5.0–8.0)

## 2020-02-20 NOTE — Addendum Note (Signed)
Addended by: Silvano Bilis on: 02/20/2020 10:25 AM   Modules accepted: Orders

## 2020-02-20 NOTE — Progress Notes (Signed)
Patient notes vaginal pressure/pain. Discussed belly band. Discussed contraception options, initially declined contraception but after further discussion she just notes issues with hormones in the past. Discussed non-hormonal options. For 1 hour glucola today as she did not complete early glucola nor did she get her A1c labs drawn earlier in pregnancy.  Has Anesthesia appt September 3rd. RTC in 4 weeks.    The following were addressed during this visit:  Breastfeeding Education - Early initiation of breastfeeding    Comments: Keeps milk supply adequate, helps contract uterus and slow bleeding, and early milk is the perfect first food and is easy to digest.   - The importance of exclusive breastfeeding    Comments: Provides antibodies, Lower risk of breast and ovarian cancers, and type-2 diabetes,Helps your body recover, Reduced chance of SIDS.   - Exclusive breastfeeding for the first 6 months    Comments: Builds a healthy milk supply and keeps it up, protects baby from sickness and disease, and breastmilk has everything your baby needs for the first 6 months.     Hildred Laser, MD [facility]

## 2020-02-21 LAB — GLUCOSE, 1 HOUR GESTATIONAL: Gestational Diabetes Screen: 137 mg/dL (ref 65–139)

## 2020-02-21 LAB — TSH: TSH: 1.44 u[IU]/mL (ref 0.450–4.500)

## 2020-02-28 DIAGNOSIS — Z419 Encounter for procedure for purposes other than remedying health state, unspecified: Secondary | ICD-10-CM | POA: Diagnosis not present

## 2020-03-01 ENCOUNTER — Inpatient Hospital Stay: Admission: RE | Admit: 2020-03-01 | Payer: 59 | Source: Ambulatory Visit

## 2020-03-19 ENCOUNTER — Other Ambulatory Visit: Payer: Self-pay

## 2020-03-19 ENCOUNTER — Encounter: Payer: Self-pay | Admitting: Family Medicine

## 2020-03-19 ENCOUNTER — Ambulatory Visit (INDEPENDENT_AMBULATORY_CARE_PROVIDER_SITE_OTHER): Payer: Medicaid Other | Admitting: Family Medicine

## 2020-03-19 DIAGNOSIS — O99210 Obesity complicating pregnancy, unspecified trimester: Secondary | ICD-10-CM

## 2020-03-19 DIAGNOSIS — O0990 Supervision of high risk pregnancy, unspecified, unspecified trimester: Secondary | ICD-10-CM | POA: Diagnosis not present

## 2020-03-19 DIAGNOSIS — Z23 Encounter for immunization: Secondary | ICD-10-CM | POA: Diagnosis not present

## 2020-03-19 NOTE — Patient Instructions (Signed)

## 2020-03-19 NOTE — Progress Notes (Signed)
   PRENATAL VISIT NOTE  Subjective:  Nichole Carlson is a 24 y.o. G1P0 at [redacted]w[redacted]d being seen today for transferring prenatal care form [facility] so she can deliver in [place].  She is currently monitored for the following issues for this low-risk pregnancy and has Supervision of high risk pregnancy, antepartum and Obesity complicating peripregnancy, antepartum on their problem list.  Patient reports no complaints.  Contractions: Irritability. Vag. Bleeding: None.  Movement: Absent. Denies leaking of fluid.   The following portions of the patient's history were reviewed and updated as appropriate: allergies, current medications, past family history, past medical history, past social history, past surgical history and problem list.   Objective:   Vitals:   03/19/20 1037  BP: 112/72  Pulse: 73  Weight: (!) 320 lb (145.2 kg)    Fetal Status: Fetal Heart Rate (bpm): 141 Fundal Height: 33 cm Movement: Absent     General:  Alert, oriented and cooperative. Patient is in no acute distress.  Skin: Skin is warm and dry. No rash noted.   Cardiovascular: Normal heart rate noted  Respiratory: Normal respiratory effort, no problems with respiration noted  Abdomen: Soft, gravid, appropriate for gestational age.  Pain/Pressure: Present     Pelvic: Cervical exam deferred        Extremities: Normal range of motion.  Edema: None  Mental Status: Normal mood and affect. Normal behavior. Normal judgment and thought content.   Assessment and Plan:  Pregnancy: G1P0 at [redacted]w[redacted]d 1. Supervision of high risk pregnancy, antepartum Continue routine prenatal care.  2. Obesity complicating peripregnancy, antepartum 1 hour was 137-->will check 2 hour. Risks of undiagnosed GDM reviewed. On ASA as she remembers it.  Preterm labor symptoms and general obstetric precautions including but not limited to vaginal bleeding, contractions, leaking of fluid and fetal movement were reviewed in detail with the patient. Please refer to  After Visit Summary for other counseling recommendations.   Return in 2 weeks (on 04/02/2020) for HRC, 28 wk labs.  Future Appointments  Date Time Provider Department Center  04/02/2020  8:15 AM Anyanwu, Jethro Bastos, MD [facility]    Reva Bores, MD

## 2020-03-19 NOTE — Addendum Note (Signed)
Addended by: Reva Bores on: 03/19/2020 05:06 PM   Modules accepted: Level of Service

## 2020-03-29 DIAGNOSIS — Z419 Encounter for procedure for purposes other than remedying health state, unspecified: Secondary | ICD-10-CM | POA: Diagnosis not present

## 2020-04-02 ENCOUNTER — Encounter: Payer: Medicaid Other | Admitting: Obstetrics & Gynecology

## 2020-04-17 ENCOUNTER — Ambulatory Visit (INDEPENDENT_AMBULATORY_CARE_PROVIDER_SITE_OTHER): Payer: Medicaid Other | Admitting: Student

## 2020-04-17 ENCOUNTER — Other Ambulatory Visit: Payer: Self-pay

## 2020-04-17 VITALS — BP 137/76 | HR 83 | Wt 327.0 lb

## 2020-04-17 DIAGNOSIS — O0990 Supervision of high risk pregnancy, unspecified, unspecified trimester: Secondary | ICD-10-CM

## 2020-04-17 DIAGNOSIS — Z3A32 32 weeks gestation of pregnancy: Secondary | ICD-10-CM

## 2020-04-17 LAB — CBC
Hematocrit: 35.3 % (ref 34.0–46.6)
Hemoglobin: 11.9 g/dL (ref 11.1–15.9)
MCH: 29 pg (ref 26.6–33.0)
MCHC: 33.7 g/dL (ref 31.5–35.7)
MCV: 86 fL (ref 79–97)
Platelets: 173 10*3/uL (ref 150–450)
RBC: 4.1 x10E6/uL (ref 3.77–5.28)
RDW: 12.8 % (ref 11.7–15.4)
WBC: 10.2 10*3/uL (ref 3.4–10.8)

## 2020-04-17 NOTE — Progress Notes (Signed)
   PRENATAL VISIT NOTE  Subjective:  Nichole Carlson is a 24 y.o. G1P0 at [redacted]w[redacted]d being seen today for ongoing prenatal care.  She is currently monitored for the following issues for this high-risk pregnancy and has Supervision of high risk pregnancy, antepartum and Obesity complicating peripregnancy, antepartum on their problem list.  Patient reports no complaints. She is unsure about birth control. Many questions about laboring with an epidural, nitrous oxide.   Contractions: Not present. Vag. Bleeding: None.  Movement: Present. Denies leaking of fluid.   The following portions of the patient's history were reviewed and updated as appropriate: allergies, current medications, past family history, past medical history, past social history, past surgical history and problem list.   Objective:   Vitals:   04/17/20 0934  BP: 137/76  Pulse: 83  Weight: (!) 327 lb (148.3 kg)    Fetal Status: Fetal Heart Rate (bpm): 146 Fundal Height: 36 cm Movement: Present     General:  Alert, oriented and cooperative. Patient is in no acute distress.  Skin: Skin is warm and dry. No rash noted.   Cardiovascular: Normal heart rate noted  Respiratory: Normal respiratory effort, no problems with respiration noted  Abdomen: Soft, gravid, appropriate for gestational age.  Pain/Pressure: Present     Pelvic: Cervical exam deferred        Extremities: Normal range of motion.  Edema: None  Mental Status: Normal mood and affect. Normal behavior. Normal judgment and thought content.   Assessment and Plan:  Pregnancy: G1P0 at [redacted]w[redacted]d 1. [redacted] weeks gestation of pregnancy -Discussed birth control, she does not like how it makes her feel and she prefers to use natural methods.  -Discussed pain techniques and alternatives to epidural, including nitrous oxide.  -Discussed with patient importance of exercise, taking classes on childbirth, to prepare for labor. Discussed labor is a test of mental and physical strength and that  preparation now will help her have the birth experience she wants.  -Information given on childbirth classes at [facility] and how to sign up -Given Obesity, will order MFM Detailed as patient does not have MFM anatomy scan yet - Glucose Tolerance, 2 Hours w/1 Hour - CBC - RPR - HIV Antibody (routine testing w rflx)  2. Supervision of high risk pregnancy, antepartum  - Glucose Tolerance, 2 Hours w/1 Hour - CBC - RPR - HIV Antibody (routine testing w rflx)  Preterm labor symptoms and general obstetric precautions including but not limited to vaginal bleeding, contractions, leaking of fluid and fetal movement were reviewed in detail with the patient. Please refer to After Visit Summary for other counseling recommendations.   Return in about 2 weeks (around 05/01/2020).  Future Appointments  Date Time Provider Department Center  05/02/2020 10:45 AM Reva Bores, MD [facility]    Marylene Land, CNM

## 2020-04-17 NOTE — Patient Instructions (Addendum)
[id number]  [email or web address]    Signs and Symptoms of Labor Labor is your body's natural process of moving your baby, placenta, and umbilical cord out of your uterus. The process of labor usually starts when your baby is full-term, between 1 and 40 weeks of pregnancy. How will I know when I am close to going into labor? As your body prepares for labor and the birth of your baby, you may notice the following symptoms in the weeks and days before true labor starts:  Having a strong desire to get your home ready to receive your new baby. This is called nesting. Nesting may be a sign that labor is approaching, and it may occur several weeks before birth. Nesting may involve cleaning and organizing your home.  Passing a small amount of thick, bloody mucus out of your vagina (normal bloody show or losing your mucus plug). This may happen more than a week before labor begins, or it might occur right before labor begins as the opening of the cervix starts to widen (dilate). For some women, the entire mucus plug passes at once. For others, smaller portions of the mucus plug may gradually pass over several days.  Your baby moving (dropping) lower in your pelvis to get into position for birth (lightening). When this happens, you may feel more pressure on your bladder and pelvic bone and less pressure on your ribs. This may make it easier to breathe. It may also cause you to need to urinate more often and have problems with bowel movements.  Having "practice contractions" (Braxton Hicks contractions) that occur at irregular (unevenly spaced) intervals that are more than 10 minutes apart. This is also called false labor. False labor contractions are common after exercise or sexual activity, and they will stop if you change position, rest, or drink fluids. These contractions are usually mild and do not get stronger over time. They may feel like: ? A backache or back pain. ? Mild cramps,  similar to menstrual cramps. ? Tightening or pressure in your abdomen. Other early symptoms that labor may be starting soon include:  Nausea or loss of appetite.  Diarrhea.  Having a sudden burst of energy, or feeling very tired.  Mood changes.  Having trouble sleeping. How will I know when labor has begun? Signs that true labor has begun may include:  Having contractions that come at regular (evenly spaced) intervals and increase in intensity. This may feel like more intense tightening or pressure in your abdomen that moves to your back. ? Contractions may also feel like rhythmic pain in your upper thighs or back that comes and goes at regular intervals. ? For first-time mothers, this change in intensity of contractions often occurs at a more gradual pace. ? Women who have given birth before may notice a more rapid progression of contraction changes.  Having a feeling of pressure in the vaginal area.  Your water breaking (rupture of membranes). This is when the sac of fluid that surrounds your baby breaks. When this happens, you will notice fluid leaking from your vagina. This may be clear or blood-tinged. Labor usually starts within 24 hours of your water breaking, but it may take longer to begin. ? Some women notice this as a gush of fluid. ? Others notice that their underwear repeatedly becomes damp. Follow these instructions at home:   When labor starts, or if your water breaks, call your health care provider or nurse care line. Based on your situation, they will determine when  you should go in for an exam.  When you are in early labor, you may be able to rest and manage symptoms at home. Some strategies to try at home include: ? Breathing and relaxation techniques. ? Taking a warm bath or shower. ? Listening to music. ? Using a heating pad on the lower back for pain. If you are directed to use heat:  Place a towel between your skin and the heat source.  Leave the heat on  for 20-30 minutes.  Remove the heat if your skin turns bright red. This is especially important if you are unable to feel pain, heat, or cold. You may have a greater risk of getting burned. Get help right away if:  You have painful, regular contractions that are 5 minutes apart or less.  Labor starts before you are [redacted] weeks along in your pregnancy.  You have a fever.  You have a headache that does not go away.  You have bright red blood coming from your vagina.  You do not feel your baby moving.  You have a sudden onset of: ? Severe headache with vision problems. ? Nausea, vomiting, or diarrhea. ? Chest pain or shortness of breath. These symptoms may be an emergency. If your health care provider recommends that you go to the hospital or birth center where you plan to deliver, do not drive yourself. Have someone else drive you, or call emergency services (911 in the [place] Summary  Labor is your body's natural process of moving your baby, placenta, and umbilical cord out of your uterus.  The process of labor usually starts when your baby is full-term, between 39 and 40 weeks of pregnancy.  When labor starts, or if your water breaks, call your health care provider or nurse care line. Based on your situation, they will determine when you should go in for an exam. This information is not intended to replace advice given to you by your health care provider. Make sure you discuss any questions you have with your health care provider. Document Revised: 03/15/2017 Document Reviewed: 11/20/2016 Elsevier Patient Education  2020 [employer].

## 2020-04-18 LAB — HIV ANTIBODY (ROUTINE TESTING W REFLEX): HIV Screen 4th Generation wRfx: NONREACTIVE

## 2020-04-18 LAB — RPR: RPR Ser Ql: NONREACTIVE

## 2020-04-18 LAB — GLUCOSE TOLERANCE, 2 HOURS W/ 1HR
Glucose, 1 hour: 200 mg/dL — ABNORMAL HIGH (ref 65–179)
Glucose, 2 hour: 89 mg/dL (ref 65–152)
Glucose, Fasting: 85 mg/dL (ref 65–91)

## 2020-04-19 ENCOUNTER — Other Ambulatory Visit: Payer: Self-pay

## 2020-04-19 ENCOUNTER — Encounter: Payer: Self-pay | Admitting: Student

## 2020-04-19 ENCOUNTER — Telehealth: Payer: Self-pay

## 2020-04-19 DIAGNOSIS — O24419 Gestational diabetes mellitus in pregnancy, unspecified control: Secondary | ICD-10-CM

## 2020-04-19 HISTORY — DX: Gestational diabetes mellitus in pregnancy, unspecified control: O24.419

## 2020-04-19 NOTE — Telephone Encounter (Signed)
-----   Message from Nichole Carlson, CNM sent at 04/19/2020  9:23 AM EDT ----- This patient has gestational diabetes, at 32 weeks, we need to get her into testing and see diabetic educator as soon as possible. I have notified her by My Chart. Thank you!   Nichole Carlson

## 2020-04-19 NOTE — Telephone Encounter (Signed)
Pt called in regards to My Chart message. Informed pt referral had been sent in for nutrition educator. Pt wanted to know a few things she could do now to improve her diet. Informed pt nutrition educator would go in detail but I advised pt to cut out soda and sweet tea if possible and to increase water intake. Pt voiced understanding.

## 2020-04-19 NOTE — Telephone Encounter (Signed)
error 

## 2020-04-19 NOTE — Telephone Encounter (Signed)
-----   Message from Kathryn Lorraine Kooistra, CNM sent at 04/19/2020  9:23 AM EDT ----- This patient has gestational diabetes, at 32 weeks, we need to get her into testing and see diabetic educator as soon as possible. I have notified her by My Chart. Thank you!   Kathryn  

## 2020-04-23 ENCOUNTER — Other Ambulatory Visit: Payer: Self-pay

## 2020-04-23 MED ORDER — ACCU-CHEK GUIDE VI STRP: ORAL_STRIP | 12 refills | Status: DC

## 2020-04-23 MED ORDER — ACCU-CHEK GUIDE W/DEVICE KIT: 1.0000 | PACK | Freq: Four times a day (QID) | 0 refills | Status: DC

## 2020-04-23 MED ORDER — ACCU-CHEK SOFTCLIX LANCETS MISC: 1.0000 | Freq: Four times a day (QID) | 12 refills | Status: DC

## 2020-04-29 ENCOUNTER — Other Ambulatory Visit: Payer: Self-pay

## 2020-04-29 ENCOUNTER — Other Ambulatory Visit: Payer: Self-pay | Admitting: *Deleted

## 2020-04-29 ENCOUNTER — Encounter: Payer: Self-pay | Admitting: *Deleted

## 2020-04-29 ENCOUNTER — Ambulatory Visit: Payer: Medicaid Other | Admitting: *Deleted

## 2020-04-29 ENCOUNTER — Ambulatory Visit: Payer: Medicaid Other | Attending: Student

## 2020-04-29 DIAGNOSIS — O0990 Supervision of high risk pregnancy, unspecified, unspecified trimester: Secondary | ICD-10-CM | POA: Diagnosis present

## 2020-04-29 DIAGNOSIS — Z6841 Body Mass Index (BMI) 40.0 and over, adult: Secondary | ICD-10-CM | POA: Diagnosis not present

## 2020-04-29 DIAGNOSIS — O99210 Obesity complicating pregnancy, unspecified trimester: Secondary | ICD-10-CM | POA: Diagnosis present

## 2020-04-29 DIAGNOSIS — Z3A34 34 weeks gestation of pregnancy: Secondary | ICD-10-CM | POA: Insufficient documentation

## 2020-04-29 DIAGNOSIS — Z3A32 32 weeks gestation of pregnancy: Secondary | ICD-10-CM

## 2020-04-29 DIAGNOSIS — O99213 Obesity complicating pregnancy, third trimester: Secondary | ICD-10-CM | POA: Diagnosis not present

## 2020-04-29 DIAGNOSIS — Z419 Encounter for procedure for purposes other than remedying health state, unspecified: Secondary | ICD-10-CM | POA: Diagnosis not present

## 2020-04-29 DIAGNOSIS — O24410 Gestational diabetes mellitus in pregnancy, diet controlled: Secondary | ICD-10-CM | POA: Diagnosis not present

## 2020-04-29 DIAGNOSIS — O24419 Gestational diabetes mellitus in pregnancy, unspecified control: Secondary | ICD-10-CM | POA: Diagnosis not present

## 2020-04-29 DIAGNOSIS — E669 Obesity, unspecified: Secondary | ICD-10-CM

## 2020-04-29 DIAGNOSIS — O0993 Supervision of high risk pregnancy, unspecified, third trimester: Secondary | ICD-10-CM | POA: Diagnosis not present

## 2020-04-30 ENCOUNTER — Ambulatory Visit: Payer: Medicaid Other | Admitting: *Deleted

## 2020-05-02 ENCOUNTER — Telehealth: Payer: Self-pay | Admitting: Radiology

## 2020-05-02 ENCOUNTER — Telehealth (INDEPENDENT_AMBULATORY_CARE_PROVIDER_SITE_OTHER): Payer: Medicaid Other | Admitting: Family Medicine

## 2020-05-02 ENCOUNTER — Encounter: Payer: Medicaid Other | Admitting: Family Medicine

## 2020-05-02 VITALS — BP 143/79 | HR 70

## 2020-05-02 DIAGNOSIS — E669 Obesity, unspecified: Secondary | ICD-10-CM

## 2020-05-02 DIAGNOSIS — O0990 Supervision of high risk pregnancy, unspecified, unspecified trimester: Secondary | ICD-10-CM

## 2020-05-02 DIAGNOSIS — O99210 Obesity complicating pregnancy, unspecified trimester: Secondary | ICD-10-CM

## 2020-05-02 DIAGNOSIS — O24410 Gestational diabetes mellitus in pregnancy, diet controlled: Secondary | ICD-10-CM

## 2020-05-02 DIAGNOSIS — O99213 Obesity complicating pregnancy, third trimester: Secondary | ICD-10-CM

## 2020-05-02 DIAGNOSIS — Z3A34 34 weeks gestation of pregnancy: Secondary | ICD-10-CM

## 2020-05-02 NOTE — Patient Instructions (Addendum)
Following an appropriate diet and keeping your blood sugar under control is the most important thing to do for your health and that of your unborn baby.  Please check your blood sugar 4 times daily.  Please keep accurate BS logs and bring them with you to every visit.  Please bring your meter also.  Goals for Blood sugar should be: 1. Fasting (first thing in the morning before eating) should be less than 90.   2.  2 hours after meals should be less than 120.  Please eat 3 meals and 3 snacks.  Include protein (meat, dairy-cheese, eggs, nuts) with all meals.  Be mindful that carbohydrates increase your blood sugar.  Not just sweet food (cookies, cake, donuts, fruit, juice, soda) but also bread, pasta, rice, and potatoes.  You have to limit how many carbs you are eating.  Adding exercise, as little as 30 minutes a day can decrease your blood sugar.   Breastfeeding  Choosing to breastfeed is one of the best decisions you can make for yourself and your baby. A change in hormones during pregnancy causes your breasts to make breast milk in your milk-producing glands. Hormones prevent breast milk from being released before your baby is born. They also prompt milk flow after birth. Once breastfeeding has begun, thoughts of your baby, as well as his or her sucking or crying, can stimulate the release of milk from your milk-producing glands. Benefits of breastfeeding Research shows that breastfeeding offers many health benefits for infants and mothers. It also offers a cost-free and convenient way to feed your baby. For your baby  Your first milk (colostrum) helps your baby's digestive system to function better.  Special cells in your milk (antibodies) help your baby to fight off infections.  Breastfed babies are less likely to develop asthma, allergies, obesity, or type 2 diabetes. They are also at lower risk for sudden infant death syndrome (SIDS).  Nutrients in breast milk are better able to  meet your baby's needs compared to infant formula.  Breast milk improves your baby's brain development. For you  Breastfeeding helps to create a very special bond between you and your baby.  Breastfeeding is convenient. Breast milk costs nothing and is always available at the correct temperature.  Breastfeeding helps to burn calories. It helps you to lose the weight that you gained during pregnancy.  Breastfeeding makes your uterus return faster to its size before pregnancy. It also slows bleeding (lochia) after you give birth.  Breastfeeding helps to lower your risk of developing type 2 diabetes, osteoporosis, rheumatoid arthritis, cardiovascular disease, and breast, ovarian, uterine, and endometrial cancer later in life. Breastfeeding basics Starting breastfeeding  Find a comfortable place to sit or lie down, with your neck and back well-supported.  Place a pillow or a rolled-up blanket under your baby to bring him or her to the level of your breast (if you are seated). Nursing pillows are specially designed to help support your arms and your baby while you breastfeed.  Make sure that your baby's tummy (abdomen) is facing your abdomen.  Gently massage your breast. With your fingertips, massage from the outer edges of your breast inward toward the nipple. This encourages milk flow. If your milk flows slowly, you may need to continue this action during the feeding.  Support your breast with 4 fingers underneath and your thumb above your nipple (make the letter "C" with your hand). Make sure your fingers are well away from your nipple and your baby's   mouth.  Stroke your baby's lips gently with your finger or nipple.  When your baby's mouth is open wide enough, quickly bring your baby to your breast, placing your entire nipple and as much of the areola as possible into your baby's mouth. The areola is the colored area around your nipple. ? More areola should be visible above your baby's  upper lip than below the lower lip. ? Your baby's lips should be opened and extended outward (flanged) to ensure an adequate, comfortable latch. ? Your baby's tongue should be between his or her lower gum and your breast.  Make sure that your baby's mouth is correctly positioned around your nipple (latched). Your baby's lips should create a seal on your breast and be turned out (everted).  It is common for your baby to suck about 2-3 minutes in order to start the flow of breast milk. Latching Teaching your baby how to latch onto your breast properly is very important. An improper latch can cause nipple pain, decreased milk supply, and poor weight gain in your baby. Also, if your baby is not latched onto your nipple properly, he or she may swallow some air during feeding. This can make your baby fussy. Burping your baby when you switch breasts during the feeding can help to get rid of the air. However, teaching your baby to latch on properly is still the best way to prevent fussiness from swallowing air while breastfeeding. Signs that your baby has successfully latched onto your nipple  Silent tugging or silent sucking, without causing you pain. Infant's lips should be extended outward (flanged).  Swallowing heard between every 3-4 sucks once your milk has started to flow (after your let-down milk reflex occurs).  Muscle movement above and in front of his or her ears while sucking. Signs that your baby has not successfully latched onto your nipple  Sucking sounds or smacking sounds from your baby while breastfeeding.  Nipple pain. If you think your baby has not latched on correctly, slip your finger into the corner of your baby's mouth to break the suction and place it between your baby's gums. Attempt to start breastfeeding again. Signs of successful breastfeeding Signs from your baby  Your baby will gradually decrease the number of sucks or will completely stop sucking.  Your baby will  fall asleep.  Your baby's body will relax.  Your baby will retain a small amount of milk in his or her mouth.  Your baby will let go of your breast by himself or herself. Signs from you  Breasts that have increased in firmness, weight, and size 1-3 hours after feeding.  Breasts that are softer immediately after breastfeeding.  Increased milk volume, as well as a change in milk consistency and color by the fifth day of breastfeeding.  Nipples that are not sore, cracked, or bleeding. Signs that your baby is getting enough milk  Wetting at least 1-2 diapers during the first 24 hours after birth.  Wetting at least 5-6 diapers every 24 hours for the first week after birth. The urine should be clear or pale yellow by the age of 5 days.  Wetting 6-8 diapers every 24 hours as your baby continues to grow and develop.  At least 3 stools in a 24-hour period by the age of 5 days. The stool should be soft and yellow.  At least 3 stools in a 24-hour period by the age of 7 days. The stool should be seedy and yellow.  No loss   of weight greater than 10% of birth weight during the first 3 days of life.  Average weight gain of 4-7 oz (113-198 g) per week after the age of 4 days.  Consistent daily weight gain by the age of 5 days, without weight loss after the age of 2 weeks. After a feeding, your baby may spit up a small amount of milk. This is normal. Breastfeeding frequency and duration Frequent feeding will help you make more milk and can prevent sore nipples and extremely full breasts (breast engorgement). Breastfeed when you feel the need to reduce the fullness of your breasts or when your baby shows signs of hunger. This is called "breastfeeding on demand." Signs that your baby is hungry include:  Increased alertness, activity, or restlessness.  Movement of the head from side to side.  Opening of the mouth when the corner of the mouth or Grimm is stroked (rooting).  Increased sucking  sounds, smacking lips, cooing, sighing, or squeaking.  Hand-to-mouth movements and sucking on fingers or hands.  Fussing or crying. Avoid introducing a pacifier to your baby in the first 4-6 weeks after your baby is born. After this time, you may choose to use a pacifier. Research has shown that pacifier use during the first year of a baby's life decreases the risk of sudden infant death syndrome (SIDS). Allow your baby to feed on each breast as long as he or she wants. When your baby unlatches or falls asleep while feeding from the first breast, offer the second breast. Because newborns are often sleepy in the first few weeks of life, you may need to awaken your baby to get him or her to feed. Breastfeeding times will vary from baby to baby. However, the following rules can serve as a guide to help you make sure that your baby is properly fed:  Newborns (babies 4 weeks of age or younger) may breastfeed every 1-3 hours.  Newborns should not go without breastfeeding for longer than 3 hours during the day or 5 hours during the night.  You should breastfeed your baby a minimum of 8 times in a 24-hour period. Breast milk pumping     Pumping and storing breast milk allows you to make sure that your baby is exclusively fed your breast milk, even at times when you are unable to breastfeed. This is especially important if you go back to work while you are still breastfeeding, or if you are not able to be present during feedings. Your lactation consultant can help you find a method of pumping that works best for you and give you guidelines about how long it is safe to store breast milk. Caring for your breasts while you breastfeed Nipples can become dry, cracked, and sore while breastfeeding. The following recommendations can help keep your breasts moisturized and healthy:  Avoid using soap on your nipples.  Wear a supportive bra designed especially for nursing. Avoid wearing underwire-style bras or  extremely tight bras (sports bras).  Air-dry your nipples for 3-4 minutes after each feeding.  Use only cotton bra pads to absorb leaked breast milk. Leaking of breast milk between feedings is normal.  Use lanolin on your nipples after breastfeeding. Lanolin helps to maintain your skin's normal moisture barrier. Pure lanolin is not harmful (not toxic) to your baby. You may also hand express a few drops of breast milk and gently massage that milk into your nipples and allow the milk to air-dry. In the first few weeks after giving birth,   some women experience breast engorgement. Engorgement can make your breasts feel heavy, warm, and tender to the touch. Engorgement peaks within 3-5 days after you give birth. The following recommendations can help to ease engorgement:  Completely empty your breasts while breastfeeding or pumping. You may want to start by applying warm, moist heat (in the shower or with warm, water-soaked hand towels) just before feeding or pumping. This increases circulation and helps the milk flow. If your baby does not completely empty your breasts while breastfeeding, pump any extra milk after he or she is finished.  Apply ice packs to your breasts immediately after breastfeeding or pumping, unless this is too uncomfortable for you. To do this: ? Put ice in a plastic bag. ? Place a towel between your skin and the bag. ? Leave the ice on for 20 minutes, 2-3 times a day.  Make sure that your baby is latched on and positioned properly while breastfeeding. If engorgement persists after 48 hours of following these recommendations, contact your health care provider or a lactation consultant. Overall health care recommendations while breastfeeding  Eat 3 healthy meals and 3 snacks every day. Well-nourished mothers who are breastfeeding need an additional 450-500 calories a day. You can meet this requirement by increasing the amount of a balanced diet that you eat.  Drink enough water  to keep your urine pale yellow or clear.  Rest often, relax, and continue to take your prenatal vitamins to prevent fatigue, stress, and low vitamin and mineral levels in your body (nutrient deficiencies).  Do not use any products that contain nicotine or tobacco, such as cigarettes and e-cigarettes. Your baby may be harmed by chemicals from cigarettes that pass into breast milk and exposure to secondhand smoke. If you need help quitting, ask your health care provider.  Avoid alcohol.  Do not use illegal drugs or marijuana.  Talk with your health care provider before taking any medicines. These include over-the-counter and prescription medicines as well as vitamins and herbal supplements. Some medicines that may be harmful to your baby can pass through breast milk.  It is possible to become pregnant while breastfeeding. If birth control is desired, ask your health care provider about options that will be safe while breastfeeding your baby. Where to find more information: [employer]: www.llli.org Contact a health care provider if:  You feel like you want to stop breastfeeding or have become frustrated with breastfeeding.  Your nipples are cracked or bleeding.  Your breasts are red, tender, or warm.  You have: ? Painful breasts or nipples. ? A swollen area on either breast. ? A fever or chills. ? Nausea or vomiting. ? Drainage other than breast milk from your nipples.  Your breasts do not become full before feedings by the fifth day after you give birth.  You feel sad and depressed.  Your baby is: ? Too sleepy to eat well. ? Having trouble sleeping. ? More than 1 week old and wetting fewer than 6 diapers in a 24-hour period. ? Not gaining weight by 5 days of age.  Your baby has fewer than 3 stools in a 24-hour period.  Your baby's skin or the white parts of his or her eyes become yellow. Get help right away if:  Your baby is overly tired (lethargic) and  does not want to wake up and feed.  Your baby develops an unexplained fever. Summary  Breastfeeding offers many health benefits for infant and mothers.  Try to breastfeed your   infant when he or she shows early signs of hunger.  Gently tickle or stroke your baby's lips with your finger or nipple to allow the baby to open his or her mouth. Bring the baby to your breast. Make sure that much of the areola is in your baby's mouth. Offer one side and burp the baby before you offer the other side.  Talk with your health care provider or lactation consultant if you have questions or you face problems as you breastfeed. This information is not intended to replace advice given to you by your health care provider. Make sure you discuss any questions you have with your health care provider. Document Revised: 09/09/2017 Document Reviewed: 07/17/2016 [employer] Patient Education  2020 [employer]  

## 2020-05-02 NOTE — Progress Notes (Signed)
Pt does not have baby scripts  Pt states fasting blood sugars 82-87 Pt states 2 hr PP is 118

## 2020-05-02 NOTE — Telephone Encounter (Signed)
Left message for patient to call [id number] to schedule 36 wk [facility] appointment next week,

## 2020-05-03 NOTE — Progress Notes (Signed)
OBSTETRICS PRENATAL VIRTUAL VISIT ENCOUNTER NOTE  Provider location: [facility] [place] [facility] [place] [facility]   I connected with Nichole Carlson on 05/03/20 at 10:45 AM EDT by MyChart Video Encounter at home and verified that I am speaking with the correct person using two identifiers.   I discussed the limitations, risks, security and privacy concerns of performing an evaluation and management service virtually and the availability of in person appointments. I also discussed with the patient that there may be a patient responsible charge related to this service. The patient expressed understanding and agreed to proceed. Subjective:  Nichole Carlson is a 24 y.o. G1P0 at 10w6dbeing seen today for ongoing prenatal care.  She is currently monitored for the following issues for this high-risk pregnancy and has Supervision of high risk pregnancy, antepartum; Obesity complicating peripregnancy, antepartum; and Gestational diabetes on their problem list.  Patient reports no complaints.  Contractions: Not present. Vag. Bleeding: None.  Movement: Present. Denies any leaking of fluid.   The following portions of the patient's history were reviewed and updated as appropriate: allergies, current medications, past family history, past medical history, past social history, past surgical history and problem list.   Objective:   Vitals:   05/02/20 1045  BP: (!) 143/79  Pulse: 70    Fetal Status:     Movement: Present     General:  Alert, oriented and cooperative. Patient is in no acute distress.  Respiratory: Normal respiratory effort, no problems with respiration noted  Mental Status: Normal mood and affect. Normal behavior. Normal judgment and thought content.  Rest of physical exam deferred due to type of encounter  Imaging: UKoreaMFM OB DETAIL +14 WK  Result Date: 04/29/2020 ----------------------------------------------------------------------  OBSTETRICS REPORT                       (Signed  Final 04/29/2020 12:57 pm) ---------------------------------------------------------------------- Patient Info  ID #:       [id number]                         D.O.B.:  [birth year](24 yrs)  Name:       Nichole Carlson                   Visit Date: 04/29/2020 08:37 am ---------------------------------------------------------------------- Performed By  Attending:        RTama HighMD        Ref. Address:     [address] Performed By:     JGeorgie Chard       [facility]  Referred By:      [facility] ---------------------------------------------------------------------- Orders  #  Description                           Code        Ordered By  1  [place] MFM OB DETAIL +14 [place]               [id number]    Maye Hides ----------------------------------------------------------------------  #  Order #                     Accession #                Episode #  1  [id number]                   [id number]                 [id number] ---------------------------------------------------------------------- Indications  Obesity complicating pregnancy, third          O99.213  trimester (BMI 90)  Encounter for antenatal screening for          Z36.3  malformations  [redacted] weeks gestation of pregnancy                Z3A.34  Gestational diabetes in pregnancy,             O24.419  unspecified control ---------------------------------------------------------------------- Fetal Evaluation  Num Of Fetuses:         1  Fetal Heart Rate(bpm):  141  Cardiac Activity:       Observed  Presentation:           Cephalic  Placenta:               Anterior  P. Cord Insertion:      Visualized  Amniotic Fluid  AFI FV:      Within normal limits  AFI Sum(cm)      %Tile       Largest Pocket(cm)  17.1            62          5.61  RUQ(cm)       RLQ(cm)       LUQ(cm)        LLQ(cm)  5.4           5.61          2.79           3.3 ---------------------------------------------------------------------- Biometry  BPD:      89.9  mm     G. Age:  36w 3d         94  %    CI:        77.61   %    70 - 86  FL/HC:      21.2   %    19.4 - 21.8  HC:       323   mm     G. Age:  36w 4d         71  %    HC/AC:      0.99        0.96 - 1.11  AC:      324.8  mm     G. Age:  36w 3d         96  %    FL/BPD:     76.3   %    71 - 87  FL:       68.6  mm     G. Age:  35w 2d         66  %    FL/AC:      21.1   %    20 - 24  HUM:      62.6  mm     G. Age:  36w 2d       > 95  %  LV:        5.1  mm  Est. FW:    2852  gm      6 lb 5 oz     91  % ---------------------------------------------------------------------- OB History  Gravidity:    1         Term:   0 ---------------------------------------------------------------------- Gestational Age  U/S Today:     36w 1d                                        EDD:   05/26/20  Best:          34w 2d     Det. By:  Previous Ultrasound      EDD:   06/08/20                                      (11/02/19) ---------------------------------------------------------------------- Anatomy  Cranium:               Appears normal         LVOT:                   Not well visualized  Cavum:                 Appears normal         Aortic Arch:            Not well visualized  Ventricles:            Appears normal         Ductal Arch:            Not well visualized  Choroid Plexus:        Appears normal         Diaphragm:              Appears normal  Cerebellum:            Appears normal         Stomach:                Appears normal, left  sided  Posterior Fossa:       Appears normal         Abdomen:                Appears normal  Nuchal Fold:           Not  applicable (>02    Abdominal Wall:         Not well visualized                         wks GA)  Face:                  Not well visualized    Cord Vessels:           Not well visualized  Lips:                  Appears normal         Kidneys:                Appear normal  Palate:                Not well visualized    Bladder:                Appears normal  Thoracic:              Appears normal         Spine:                  Appears normal  Heart:                 Appears normal         Upper Extremities:      Not well visualized                         (4CH, axis, and                         situs)  RVOT:                  Not well visualized    Lower Extremities:      Not well visualized  Other:  Technically difficult due to maternal habitus and fetal position. ---------------------------------------------------------------------- Impression  G1 P0.  Patient transferred her prenatal care with an intention  to deliver at [facility] [place].  She has a recent diagnosis of gestational diabetes and has  not started checking her blood glucose.  She will be picking  up a glucometer from the pharmacy.  On cell-free fetal DNA screening, the risks of fetal  aneuploidies are not increased .  Midtrimester fetal anatomy  scan performed at your office was reported as normal.  On today's ultrasound, the estimated fetal weight is at the  91st percentile.  Amniotic fluid is normal good fetal activity  seen.  Fetal anatomical survey appears normal but limited by  advanced gestational age and maternal obesity.  Incidentally  observed antenatal testing is reassuring.  Maternal obesity imposes limitations on the resolution of  images, and failure to detect fetal anomalies is more common  in obese pregnant women.  I discussed the importance of checking blood glucose  measurements regularly.  Good control of diabetes prevents  adverse fetal or neonatal outcomes.  We will initiate weekly antenatal testing because of  uncertain  control  of diabetes. ---------------------------------------------------------------------- Recommendations  -Weekly BPP till delivery.  -Fetal growth assessment in 3 weeks. ----------------------------------------------------------------------                  Tama High, MD Electronically Signed Final Report   04/29/2020 12:57 pm ----------------------------------------------------------------------   Assessment and Plan:  Pregnancy: G1P0 at 51w6d1. Supervision of high risk pregnancy, antepartum For cultures in person next visit  2. Obesity complicating peripregnancy, antepartum   3. Diet controlled gestational diabetes mellitus (GDM) in third trimester Has not met with diabetes educator due to CBuchanansx's. Today's visit virtual for same reason  Has meter and familiar with diet. Has not seen diabetes educator. Following an appropriate diet and keeping your blood sugar under control is the most important thing to do for your health and that of your unborn baby.  Please check your blood sugar 4 times daily.  Please keep accurate BS logs and bring them with you to every visit.  Please bring your meter also.  Goals for Blood sugar should be: 1. Fasting (first thing in the morning before eating) should be less than 90.   2.  2 hours after meals should be less than 120.  Please eat 3 meals and 3 snacks.  Include protein (meat, dairy-cheese, eggs, nuts) with all meals.  Be mindful that carbohydrates increase your blood sugar.  Not just sweet food (cookies, cake, donuts, fruit, juice, soda) but also bread, pasta, rice, and potatoes.  You have to limit how many carbs you are eating.  Adding exercise, as little as 30 minutes a day can decrease your blood sugar.   Preterm labor symptoms and general obstetric precautions including but not limited to vaginal bleeding, contractions, leaking of fluid and fetal movement were reviewed in detail with the patient. I discussed the  assessment and treatment plan with the patient. The patient was provided an opportunity to ask questions and all were answered. The patient agreed with the plan and demonstrated an understanding of the instructions. The patient was advised to call back or seek an in-person office evaluation[facility]. Please refer to After Visit Summary for other counseling recommendations.   I provided 11 minutes of face-to-face time during this encounter.  Return in 1 week (on 05/09/2020).  Future Appointments  Date Time Provider [place] 05/06/2020  7:15 AM [facility] 05/06/2020  7:30 AM [facility]/13[facility]  7:15 AM [facility]/13/[facility]  7:30 [facility]  7:30 AM [facility]  7:45 AM [facility] 05/27/2020  7:30 AM [facility] 05/27/[facility]  7:45 AM [facility] US5 [facility]   TDonnamae Jude MD [facility]

## 2020-05-06 ENCOUNTER — Encounter: Payer: Self-pay | Admitting: *Deleted

## 2020-05-06 ENCOUNTER — Ambulatory Visit: Payer: Medicaid Other | Admitting: *Deleted

## 2020-05-06 ENCOUNTER — Other Ambulatory Visit: Payer: Self-pay

## 2020-05-06 ENCOUNTER — Ambulatory Visit: Payer: Medicaid Other | Attending: Obstetrics and Gynecology

## 2020-05-06 ENCOUNTER — Other Ambulatory Visit: Payer: Self-pay | Admitting: *Deleted

## 2020-05-06 DIAGNOSIS — Z3A35 35 weeks gestation of pregnancy: Secondary | ICD-10-CM | POA: Diagnosis not present

## 2020-05-06 DIAGNOSIS — O24913 Unspecified diabetes mellitus in pregnancy, third trimester: Secondary | ICD-10-CM

## 2020-05-06 DIAGNOSIS — O0990 Supervision of high risk pregnancy, unspecified, unspecified trimester: Secondary | ICD-10-CM | POA: Insufficient documentation

## 2020-05-06 DIAGNOSIS — O99213 Obesity complicating pregnancy, third trimester: Secondary | ICD-10-CM | POA: Diagnosis not present

## 2020-05-06 DIAGNOSIS — O99210 Obesity complicating pregnancy, unspecified trimester: Secondary | ICD-10-CM | POA: Insufficient documentation

## 2020-05-06 DIAGNOSIS — Z6841 Body Mass Index (BMI) 40.0 and over, adult: Secondary | ICD-10-CM | POA: Diagnosis not present

## 2020-05-06 DIAGNOSIS — O24419 Gestational diabetes mellitus in pregnancy, unspecified control: Secondary | ICD-10-CM | POA: Diagnosis not present

## 2020-05-13 ENCOUNTER — Ambulatory Visit: Payer: Medicaid Other | Admitting: *Deleted

## 2020-05-13 ENCOUNTER — Ambulatory Visit: Payer: Medicaid Other | Attending: Obstetrics and Gynecology

## 2020-05-13 ENCOUNTER — Other Ambulatory Visit: Payer: Self-pay | Admitting: Obstetrics and Gynecology

## 2020-05-13 ENCOUNTER — Other Ambulatory Visit: Payer: Self-pay

## 2020-05-13 ENCOUNTER — Encounter: Payer: Self-pay | Admitting: *Deleted

## 2020-05-13 DIAGNOSIS — O24419 Gestational diabetes mellitus in pregnancy, unspecified control: Secondary | ICD-10-CM | POA: Diagnosis not present

## 2020-05-13 DIAGNOSIS — O0990 Supervision of high risk pregnancy, unspecified, unspecified trimester: Secondary | ICD-10-CM | POA: Insufficient documentation

## 2020-05-13 DIAGNOSIS — O99213 Obesity complicating pregnancy, third trimester: Secondary | ICD-10-CM

## 2020-05-13 DIAGNOSIS — O99210 Obesity complicating pregnancy, unspecified trimester: Secondary | ICD-10-CM | POA: Insufficient documentation

## 2020-05-13 DIAGNOSIS — Z362 Encounter for other antenatal screening follow-up: Secondary | ICD-10-CM | POA: Diagnosis not present

## 2020-05-13 DIAGNOSIS — Z3A36 36 weeks gestation of pregnancy: Secondary | ICD-10-CM

## 2020-05-13 DIAGNOSIS — Z6841 Body Mass Index (BMI) 40.0 and over, adult: Secondary | ICD-10-CM

## 2020-05-13 NOTE — Procedures (Signed)
Nichole Carlson 10/28/1995 [redacted]w[redacted]d  Fetus A Non-Stress Test Interpretation for 05/13/20  Indication: Diabetes  Fetal Heart Rate A Mode: External Baseline Rate (A): 140 bpm Variability: Moderate Accelerations: 15 x 15 Decelerations: None Multiple birth?: No  Uterine Activity Mode: Palpation, Toco Contraction Frequency (min): Occas Contraction Quality: Mild Resting Tone Palpated: Relaxed Resting Time: Adequate  Interpretation (Fetal Testing) Nonstress Test Interpretation: Reactive Comments: Dr. Parke Poisson reviewed tracing.

## 2020-05-14 ENCOUNTER — Other Ambulatory Visit: Payer: Self-pay | Admitting: *Deleted

## 2020-05-14 MED ORDER — PREPLUS 27-1 MG PO TABS: 1.0000 | ORAL_TABLET | Freq: Every day | ORAL | 13 refills | Status: DC

## 2020-05-20 ENCOUNTER — Ambulatory Visit: Payer: Medicaid Other | Admitting: *Deleted

## 2020-05-20 ENCOUNTER — Ambulatory Visit: Payer: Medicaid Other | Attending: Obstetrics and Gynecology

## 2020-05-20 ENCOUNTER — Other Ambulatory Visit: Payer: Self-pay | Admitting: Obstetrics and Gynecology

## 2020-05-20 ENCOUNTER — Ambulatory Visit (INDEPENDENT_AMBULATORY_CARE_PROVIDER_SITE_OTHER): Payer: Medicaid Other | Admitting: Obstetrics and Gynecology

## 2020-05-20 ENCOUNTER — Other Ambulatory Visit: Payer: Self-pay

## 2020-05-20 ENCOUNTER — Encounter: Payer: Self-pay | Admitting: *Deleted

## 2020-05-20 ENCOUNTER — Other Ambulatory Visit (HOSPITAL_COMMUNITY)
Admission: RE | Admit: 2020-05-20 | Discharge: 2020-05-20 | Disposition: A | Discharge status: Home / Self Care | Payer: Medicaid Other | Source: Ambulatory Visit | Attending: Obstetrics and Gynecology | Admitting: Obstetrics and Gynecology

## 2020-05-20 VITALS — BP 131/80 | HR 66 | Wt 337.0 lb

## 2020-05-20 DIAGNOSIS — Z3A37 37 weeks gestation of pregnancy: Secondary | ICD-10-CM | POA: Diagnosis not present

## 2020-05-20 DIAGNOSIS — O24419 Gestational diabetes mellitus in pregnancy, unspecified control: Secondary | ICD-10-CM

## 2020-05-20 DIAGNOSIS — O99210 Obesity complicating pregnancy, unspecified trimester: Secondary | ICD-10-CM

## 2020-05-20 DIAGNOSIS — Z9119 Patient's noncompliance with other medical treatment and regimen: Secondary | ICD-10-CM

## 2020-05-20 DIAGNOSIS — O0990 Supervision of high risk pregnancy, unspecified, unspecified trimester: Secondary | ICD-10-CM | POA: Insufficient documentation

## 2020-05-20 DIAGNOSIS — Z6841 Body Mass Index (BMI) 40.0 and over, adult: Secondary | ICD-10-CM | POA: Insufficient documentation

## 2020-05-20 DIAGNOSIS — Z362 Encounter for other antenatal screening follow-up: Secondary | ICD-10-CM | POA: Diagnosis not present

## 2020-05-20 DIAGNOSIS — O24410 Gestational diabetes mellitus in pregnancy, diet controlled: Secondary | ICD-10-CM

## 2020-05-20 DIAGNOSIS — O99213 Obesity complicating pregnancy, third trimester: Secondary | ICD-10-CM | POA: Insufficient documentation

## 2020-05-20 DIAGNOSIS — Z91199 Patient's noncompliance with other medical treatment and regimen due to unspecified reason: Secondary | ICD-10-CM | POA: Insufficient documentation

## 2020-05-20 LAB — OB RESULTS CONSOLE GC/CHLAMYDIA: Gonorrhea: NEGATIVE

## 2020-05-20 NOTE — Procedures (Signed)
Nichole Carlson Apr 27, 1996 [redacted]w[redacted]d  Fetus A Non-Stress Test Interpretation for 05/20/20  Indication: Diabetes  Fetal Heart Rate A Mode: External Baseline Rate (A): 145 bpm Variability: Moderate Accelerations: 15 x 15 Decelerations: None Multiple birth?: No  Uterine Activity Mode: Palpation, Toco Contraction Frequency (min): Occas. W/UI Contraction Quality: Mild Resting Tone Palpated: Relaxed Resting Time: Adequate  Interpretation (Fetal Testing) Nonstress Test Interpretation: Reactive Comments: Dr. Judeth Cornfield reviewed tracing.

## 2020-05-20 NOTE — Progress Notes (Signed)
Prenatal Visit Note Date: 05/20/2020 [facility]: [facility]  Subjective:  Nichole Carlson is a 24 y.o. G1P0 at [redacted]w[redacted]d being seen today for ongoing prenatal care.  She is currently monitored for the following issues for this high-risk pregnancy and has Supervision of high risk pregnancy, antepartum; Obesity complicating peripregnancy, antepartum; Gestational diabetes; Poor compliance; and BMI 50.0-59.9, adult (HCC) on their problem list.  Patient reports no complaints.   Contractions: Irregular. Vag. Bleeding: None.  Movement: Absent. Denies leaking of fluid.   The following portions of the patient's history were reviewed and updated as appropriate: allergies, current medications, past family history, past medical history, past social history, past surgical history and problem list. Problem list updated.  Objective:   Vitals:   05/20/20 1629  BP: 131/80  Pulse: 66  Weight: (!) 337 lb (152.9 kg)    Fetal Status: Fetal Heart Rate (bpm): 142   Movement: Absent     General:  Alert, oriented and cooperative. Patient is in no acute distress.  Skin: Skin is warm and dry. No rash noted.   Cardiovascular: Normal heart rate noted  Respiratory: Normal respiratory effort, no problems with respiration noted  Abdomen: Soft, gravid, appropriate for gestational age. Pain/Pressure: Present     Pelvic:  FT/long/high        Extremities: Normal range of motion.  Edema: None  Mental Status: Normal mood and affect. Normal behavior. Normal judgment and thought content.   Urinalysis:      Assessment and Plan:  Pregnancy: G1P0 at [redacted]w[redacted]d  1. BMI 50.0-59.9, adult (HCC)  2. Supervision of high risk pregnancy, antepartum bpp 10/10 today, efw 59%, 3174gm, ac 49%. Pt set up for IOL on 11/3 @ 2345. D/w pt re: outpatient foley nv - Strep Gp B NAA - GC/Chlamydia probe amp ([place])not at [facility]  3. [redacted] weeks gestation of pregnancy  4. GDMa1 Forgot log book but gives normal qid numbers.  Random CBG 110 today (s/p food 2 hours prior)  Term labor symptoms and general obstetric precautions including but not limited to vaginal bleeding, contractions, leaking of fluid and fetal movement were reviewed in detail with the patient. Please refer to After Visit Summary for other counseling recommendations.  Return in about 1 week (around 05/27/2020) for in person, md visit.   Mexia Bing, MD

## 2020-05-21 ENCOUNTER — Telehealth (HOSPITAL_COMMUNITY): Payer: Self-pay | Admitting: *Deleted

## 2020-05-21 DIAGNOSIS — O0990 Supervision of high risk pregnancy, unspecified, unspecified trimester: Secondary | ICD-10-CM | POA: Diagnosis not present

## 2020-05-21 NOTE — Telephone Encounter (Signed)
Preadmission screen  

## 2020-05-22 ENCOUNTER — Telehealth (HOSPITAL_COMMUNITY): Payer: Self-pay | Admitting: *Deleted

## 2020-05-22 ENCOUNTER — Encounter (HOSPITAL_COMMUNITY): Payer: Self-pay | Admitting: *Deleted

## 2020-05-22 LAB — GC/CHLAMYDIA PROBE AMP (~~LOC~~) NOT AT ARMC
Chlamydia: NEGATIVE
Comment: NEGATIVE
Comment: NORMAL
Neisseria Gonorrhea: NEGATIVE

## 2020-05-22 NOTE — Telephone Encounter (Signed)
Preadmission screen  

## 2020-05-23 LAB — STREP GP B NAA: Strep Gp B NAA: NEGATIVE

## 2020-05-25 ENCOUNTER — Other Ambulatory Visit: Payer: Self-pay | Admitting: Advanced Practice Midwife

## 2020-05-27 ENCOUNTER — Other Ambulatory Visit: Payer: Self-pay

## 2020-05-27 ENCOUNTER — Ambulatory Visit: Payer: Medicaid Other | Attending: Obstetrics and Gynecology

## 2020-05-27 ENCOUNTER — Encounter: Payer: Self-pay | Admitting: *Deleted

## 2020-05-27 ENCOUNTER — Ambulatory Visit: Payer: Medicaid Other | Admitting: *Deleted

## 2020-05-27 DIAGNOSIS — Z362 Encounter for other antenatal screening follow-up: Secondary | ICD-10-CM | POA: Diagnosis not present

## 2020-05-27 DIAGNOSIS — O0990 Supervision of high risk pregnancy, unspecified, unspecified trimester: Secondary | ICD-10-CM | POA: Insufficient documentation

## 2020-05-27 DIAGNOSIS — O99210 Obesity complicating pregnancy, unspecified trimester: Secondary | ICD-10-CM

## 2020-05-27 DIAGNOSIS — O99213 Obesity complicating pregnancy, third trimester: Secondary | ICD-10-CM

## 2020-05-27 DIAGNOSIS — Z3A38 38 weeks gestation of pregnancy: Secondary | ICD-10-CM

## 2020-05-27 DIAGNOSIS — O24419 Gestational diabetes mellitus in pregnancy, unspecified control: Secondary | ICD-10-CM | POA: Diagnosis not present

## 2020-05-27 DIAGNOSIS — Z6841 Body Mass Index (BMI) 40.0 and over, adult: Secondary | ICD-10-CM

## 2020-05-28 ENCOUNTER — Other Ambulatory Visit: Payer: Self-pay

## 2020-05-28 ENCOUNTER — Ambulatory Visit (INDEPENDENT_AMBULATORY_CARE_PROVIDER_SITE_OTHER): Payer: No Typology Code available for payment source | Admitting: Obstetrics & Gynecology

## 2020-05-28 VITALS — BP 122/80 | HR 74 | Wt 339.2 lb

## 2020-05-28 DIAGNOSIS — Z3A38 38 weeks gestation of pregnancy: Secondary | ICD-10-CM

## 2020-05-28 DIAGNOSIS — O0990 Supervision of high risk pregnancy, unspecified, unspecified trimester: Secondary | ICD-10-CM

## 2020-05-28 DIAGNOSIS — O24410 Gestational diabetes mellitus in pregnancy, diet controlled: Secondary | ICD-10-CM

## 2020-05-28 DIAGNOSIS — O0993 Supervision of high risk pregnancy, unspecified, third trimester: Secondary | ICD-10-CM

## 2020-05-28 NOTE — Progress Notes (Signed)
PRENATAL VISIT NOTE  Subjective:  Alayna Mabe is a 24 y.o. G1P0 at [redacted]w[redacted]d being seen today for ongoing prenatal care.  She is currently monitored for the following issues for this high-risk pregnancy and has Supervision of high risk pregnancy, antepartum; Obesity complicating peripregnancy, antepartum; Gestational diabetes; and BMI 50.0-59.9, adult (HCC) on their problem list.  Patient reports no complaints.  Contractions: Irregular. Vag. Bleeding: None.  Movement: Present. Denies leaking of fluid.   The following portions of the patient's history were reviewed and updated as appropriate: allergies, current medications, past family history, past medical history, past social history, past surgical history and problem list.   Objective:   Vitals:   05/28/20 1616  BP: 122/80  Pulse: 74  Weight: (!) 339 lb 3.2 oz (153.9 kg)    Fetal Status: Fetal Heart Rate (bpm): 137   Movement: Present     General:  Alert, oriented and cooperative. Patient is in no acute distress.  Skin: Skin is warm and dry. No rash noted.   Cardiovascular: Normal heart rate noted  Respiratory: Normal respiratory effort, no problems with respiration noted  Abdomen: Soft, gravid, appropriate for gestational age.  Pain/Pressure: Present     Pelvic: Cervical exam deferred        Extremities: Normal range of motion.     Mental Status: Normal mood and affect. Normal behavior. Normal judgment and thought content.   Imaging: [place] MFM FETAL BPP W/NONSTRESS  Result Date: 05/20/2020 ----------------------------------------------------------------------  OBSTETRICS REPORT                       (Signed Final 05/20/2020 09:45 am) ---------------------------------------------------------------------- Patient Info  ID #:       [id number]                          D.O.B.:  03/14/1996 (24 yrs)  Name:       [name]                    Visit Date: 05/20/2020 07:47 am  ---------------------------------------------------------------------- Performed By  Attending:        Noralee Space MD        Ref. Address:     [address]                                                             [address]  Performed By:     Truitt Leep,      [facility]                    RDMS,RDCS                                [facility]  Referred By:      [facility] ---------------------------------------------------------------------- Orders  #  Description                           Code        Ordered By  1  [place] MFM FETAL BPP                      [phone]     [facility]VI [facility]     W/NONSTRESS  2  [place] MFM OB FOLLOW UP                   [id number]    RAVI [facility] ----------------------------------------------------------------------  #  Order #                     Accession #                Episode #  1  [id number]                   [id number]                 [id number]  2  [id number]                   [id number]                 [id number] ---------------------------------------------------------------------- Indications  Gestational diabetes in pregnancy,             O24.419  unspecified control  Obesity complicating pregnancy, third          O99.213  trimester (BMI 48)  Encounter for other antenatal screening        Z36.2  follow-up  [redacted] weeks gestation of pregnancy                Z3A.37 ---------------------------------------------------------------------- Fetal Evaluation  Num Of Fetuses:         1  Cardiac Activity:       Observed  Presentation:           Cephalic  Placenta:               Anterior  P. Cord Insertion:      Previously Visualized  Amniotic Fluid  AFI FV:      Within normal limits  AFI Sum(cm)     %Tile       Largest Pocket(cm)  18              69          7.46  RUQ(cm)       RLQ(cm)       LUQ(cm)        LLQ(cm)  1.51           3.75          7.46           5.29 ---------------------------------------------------------------------- Biophysical Evaluation  Amniotic F.V:   Pocket => 2 cm             F. Tone:        Observed  F. Movement:    Observed                   N.S.T:          Reactive  F. Breathing:   Observed  Score:          10/10 ---------------------------------------------------------------------- Biometry  BPD:      94.4  mm     G. Age:  38w 3d         91  %    CI:        78.71   %    70 - 86                                                          FL/HC:      21.7   %    20.8 - 22.6  HC:      336.5  mm     G. Age:  38w 4d         57  %    HC/AC:      1.02        0.92 - 1.05  AC:      328.3  mm     G. Age:  36w 5d         49  %    FL/BPD:     77.4   %    71 - 87  FL:       73.1  mm     G. Age:  37w 3d         53  %    FL/AC:      22.3   %    20 - 24  Est. FW:    3174  gm           7 lb     59  % ---------------------------------------------------------------------- OB History  Blood Type:   O+  Gravidity:    1         Term:   0 ---------------------------------------------------------------------- Gestational Age  U/S Today:     37w 6d                                        EDD:   06/04/20  Best:          37w 2d     Det. By:  Previous Ultrasound      EDD:   06/08/20                                      (11/02/19) ---------------------------------------------------------------------- Anatomy  Cranium:               Appears normal         LVOT:                   Appears normal  Cavum:                 Appears normal         Aortic Arch:            Appears normal  Ventricles:            Appears normal         Ductal Arch:            Previously seen  Choroid Plexus:  Appears normal         Diaphragm:              Previously seen  Cerebellum:            Previously seen        Stomach:                Appears normal, left                                                                        sided  Posterior Fossa:        Previously seen        Abdomen:                Appears normal  Nuchal Fold:           Not applicable (>20    Abdominal Wall:         Appears nml (cord                         wks GA)                                        insert, abd wall)  Face:                  Appears normal         Cord Vessels:           Previously seen                         (orbits and profile)  Lips:                  Appears normal         Kidneys:                Appear normal  Palate:                Appears normal         Bladder:                Appears normal  Thoracic:              Previously seen        Spine:                  Previously seen  Heart:                 Appears normal         Upper Extremities:      Not well visualized                         (4CH, axis, and                         situs)  RVOT:                  Appears normal  Lower Extremities:      Not well visualized  Other:  Technicallly difficult due to advanced GA and maternal habitus. ---------------------------------------------------------------------- Impression  Gestational diabetes.  Patient reports she is checking her  blood glucose regularly and they are within normal range.  Fetal growth is appropriate for gestational age .Amniotic fluid  is normal and good fetal activity is seen .  Antenatal testing is  reassuring.  NST is reactive.  BPP 10/10.  We reassured the patient of the findings. ---------------------------------------------------------------------- Recommendations  -BPP next week.  -Consider delivery at [redacted] weeks gestation. ----------------------------------------------------------------------                  Noralee Space, MD Electronically Signed Final Report   05/20/2020 09:45 am ----------------------------------------------------------------------  [place] MFM FETAL BPP W/NONSTRESS  Result Date: 05/13/2020 ----------------------------------------------------------------------  OBSTETRICS REPORT                       (Signed Final  05/13/2020 01:32 pm) ---------------------------------------------------------------------- Patient Info  ID #:       [id number]                          D.O.B.:  Mar 03, 1996 (24 yrs)  Name:       [name]                    Visit Date: 05/13/2020 07:40 am ---------------------------------------------------------------------- Performed By  Attending:        Ma Rings MD         Ref. Address:     [address]                                                             [address]  Performed By:     Tommie Raymond BS,       Location[facility]                    RDMS, RVT                                [facility]  Referred By:      [facility] ---------------------------------------------------------------------- Orders  #  Description                           Code        Ordered By  1  [place] MFM FETAL BPP                      [id number]     RAVI [facility]     W/NONSTRESS ----------------------------------------------------------------------  #  Order #                     Accession #                Episode #  1  [id number]                   [id number]                 [id number] ---------------------------------------------------------------------- Indications  Gestational diabetes in pregnancy,             O24.419  unspecified control  [redacted] weeks gestation of pregnancy                Z3A.36  Obesity complicating pregnancy, third          O99.213  trimester (BMI 48)  Encounter for other antenatal screening        Z36.2  follow-up ---------------------------------------------------------------------- Fetal Evaluation  Num Of Fetuses:         1  Fetal Heart Rate(bpm):  137  Cardiac Activity:       Observed  Presentation:           Cephalic  Placenta:               Anterior  P. Cord Insertion:      Previously Visualized  Amniotic Fluid  AFI FV:      Polyhydramnios  AFI  Sum(cm)     %Tile       Largest Pocket(cm)  25              95          9.3  RUQ(cm)       RLQ(cm)       LUQ(cm)        LLQ(cm)  9.3           5             4.5            6.2 ---------------------------------------------------------------------- Biophysical Evaluation  Amniotic F.V:   Polyhydramnios             F. Tone:        Observed  F. Movement:    Observed                   N.S.T:          Reactive  F. Breathing:   Observed                   Score:          10/10 ---------------------------------------------------------------------- OB History  Blood Type:   O+  Gravidity:    1         Term:   0 ---------------------------------------------------------------------- Gestational Age  Best:          36w 2d     Det. By:  Previous Ultrasound      EDD:   06/08/20                                      (11/02/19) ---------------------------------------------------------------------- Anatomy  Cranium:  Previously seen        LVOT:                   Appears normal  Cavum:                 Previously seen        Aortic Arch:            Not well visualized  Ventricles:            Appears normal         Ductal Arch:            Appears normal  Choroid Plexus:        Previously seen        Diaphragm:              Appears normal  Cerebellum:            Previously seen        Stomach:                Appears normal, left                                                                        sided  Posterior Fossa:       Previously seen        Abdomen:                Appears normal  Nuchal Fold:           Not applicable (>20    Abdominal Wall:         Not well visualized                         wks GA)  Face:                  Appears normal         Cord Vessels:           Not well visualized                         (orbits and profile)  Lips:                  Appears normal         Kidneys:                Appear normal  Palate:                Not well visualized    Bladder:                Appears normal  Thoracic:               Appears normal         Spine:                  Previously seen  Heart:                 Appears normal         Upper Extremities:      Not well visualized                         (  4CH, axis, and                         situs)  RVOT:                  Appears normal         Lower Extremities:      Not well visualized  Other:  Technically difficult due to maternal habitus and fetal position. ---------------------------------------------------------------------- Cervix Uterus Adnexa  Cervix  Not visualized (advanced GA >24wks)  Uterus  No abnormality visualized.  Right Ovary  Within normal limits. No adnexal mass visualized.  Left Ovary  Within normal limits. No adnexal mass visualized.  Cul De Sac  No free fluid seen.  Adnexa  No abnormality visualized. ---------------------------------------------------------------------- Comments  This patient was seen for a biophysical profile due to diet-  controlled gestational diabetes. She denies any problems  since her last exam.  A biophysical profile performed today was 8 out of 8. The  patient also had a reactive nonstress test today making her  total biophysical profile score 10 out of 10.  Mild polyhydramnios with a total AFI of 25 cm was noted  today.  She will return in 1 week for another growth ultrasound and  biophysical profile. ----------------------------------------------------------------------                   Ma Rings, MD Electronically Signed Final Report   05/13/2020 01:32 pm ----------------------------------------------------------------------  [place] MFM FETAL BPP WO NON STRESS  Result Date: 05/27/2020 ----------------------------------------------------------------------  OBSTETRICS REPORT                        (Signed Final 05/27/2020 10:59 pm) ---------------------------------------------------------------------- Patient Info  ID #:       [id number]                          D.O.B.:  September 15, 1995 (24 yrs)  Name:       [name]                     Visit Date: 05/27/2020 07:50 am ---------------------------------------------------------------------- Performed By  Attending:        Lin Landsman      Ref. Address:      [address]                    [address]                                                              [address]  Performed By:     Jenel Lucks     Location[facility]                    RDMS                                      [facility]  Referred By:      [facility] ---------------------------------------------------------------------- Orders  #  Description                           Code        Ordered By  1  [place] MFM FETAL BPP WO NON               16109.60    RAVI [facility]     STRESS ----------------------------------------------------------------------  #  Order #                     Accession #                Episode #  1  [id number]                   [id number]                 [id number] ---------------------------------------------------------------------- Indications  [redacted] weeks gestation of pregnancy                 Z3A.38  Gestational diabetes in pregnancy,              O24.419  unspecified control  Obesity complicating pregnancy, third           O99.213  trimester (BMI 48)  Encounter for other antenatal screening         Z36.2  follow-up ---------------------------------------------------------------------- Fetal Evaluation  Num Of Fetuses:          1  Preg. Location:          Intrauterine  Fetal Heart Rate(bpm):   138  Cardiac Activity:        Observed  Presentation:            Cephalic  Placenta:                Anterior  Amniotic Fluid  AFI FV:      Within normal limits  AFI Sum(cm)     %Tile       Largest Pocket(cm)  13.4            51          4.79  RUQ(cm)       RLQ(cm)       LUQ(cm)        LLQ(cm)  4.5           0             4.79           4.11  ---------------------------------------------------------------------- Biophysical Evaluation  Amniotic F.V:   Pocket => 2 cm             F. Tone:         Observed  F. Movement:    Observed                   Score:           8/8  F. Breathing:   Observed ---------------------------------------------------------------------- Biometry  LV:  5.1  mm ---------------------------------------------------------------------- OB History  Blood Type:   O+  Gravidity:    1         Term:   0 ---------------------------------------------------------------------- Gestational Age  Best:          38w 2d     Det. By:  Previous Ultrasound      EDD:   06/08/20                                      (11/02/19) ---------------------------------------------------------------------- Anatomy  Cranium:               Previously seen        LVOT:                   Previously seen  Cavum:                 Previously seen        Aortic Arch:            Previously seen  Ventricles:            Appears normal         Ductal Arch:            Previously seen  Choroid Plexus:        Previously seen        Diaphragm:              Appears normal  Cerebellum:            Previously seen        Stomach:                Appears normal, left                                                                        sided  Posterior Fossa:       Previously seen        Abdomen:                Previously seen  Nuchal Fold:           Not applicable (>20    Abdominal Wall:         Appears nml (cord                         wks GA)                                        insert, abd wall)  Face:                  Orbits and profile     Cord Vessels:           Previously seen                         previously seen  Lips:                  Previously  seen        Kidneys:                Appear normal  Palate:                Previously seen        Bladder:                Appears normal  Thoracic:              Previously seen        Spine:                  Previously seen   Heart:                 Previously seen        Upper Extremities:      Not well visualized  RVOT:                  Previously seen        Lower Extremities:      Not well visualized  Other:  Technicallly difficult due to advanced GA and maternal habitus. ---------------------------------------------------------------------- Cervix Uterus Adnexa  Cervix  Not visualized (advanced GA >24wks)  Uterus  No abnormality visualized.  Right Ovary  Within normal limits.  Left Ovary  Within normal limits.  Cul De Sac  No free fluid seen.  Adnexa  No abnormality visualized. ---------------------------------------------------------------------- Impression  Antenatal testing performed given maternal elevated BMI and  GDM  The biophysical profile was 8/8 with good fetal movement and  amniotic fluid volume. ---------------------------------------------------------------------- Recommendations  Delivery scheduled on 12/3 ----------------------------------------------------------------------               Lin Landsman, MD Electronically Signed Final Report   05/27/2020 10:59 pm ----------------------------------------------------------------------  [place] MFM FETAL BPP WO NON STRESS  Result Date: 05/06/2020 ----------------------------------------------------------------------  OBSTETRICS REPORT                       (Signed Final 05/06/2020 08:28 am) ---------------------------------------------------------------------- Patient Info  ID #:       [id number]                          D.O.B.:  11-11-1995 (24 yrs)  Name:       [name]                    Visit Date: 05/06/2020 07:34 am ---------------------------------------------------------------------- Performed By  Attending:        Noralee Space MD        Ref. Address:     [address]                                                             [address]  Performed By:     Reinaldo Raddle            [facility]                    RDMS  [facility]  [facility] ---------------------------------------------------------------------- Orders  #  Description                           Code        Ordered By  1  [place] MFM FETAL BPP WO NON               76819.01    [facility]     [facility] ----------------------------------------------------------------------  #  Order #                     Accession #                Episode #  1  [id number]                   [id number]                 [id number] ---------------------------------------------------------------------- Indications  Gestational diabetes in pregnancy,             O24.419  unspecified control  Obesity complicating pregnancy, third          O99.213  trimester (BMI 48)  [redacted] weeks gestation of pregnancy ---------------------------------------------------------------------- Fetal Evaluation  Num Of Fetuses:         1  Fetal Heart Rate(bpm):  127  Cardiac Activity:       Observed  Presentation:           Cephalic  Placenta:               Anterior  P. Cord Insertion:      Previously Visualized  Amniotic Fluid  AFI FV:      Within normal limits  AFI Sum(cm)     %Tile       Largest Pocket(cm)  12.06           36          3.26  RUQ(cm)       RLQ(cm)       LUQ(cm)        LLQ(cm)  2.97          2.66          3.26           3.17 ---------------------------------------------------------------------- Biophysical Evaluation  Amniotic F.V:   Pocket => 2 cm             F. Tone:        Observed  F. Movement:    Observed                   Score:          8/8  F. Breathing:   Observed ---------------------------------------------------------------------- OB History  Gravidity:    1         Term:   0 ---------------------------------------------------------------------- Gestational Age  Best:          35w 2d     Det. By:  Previous Ultrasound       EDD:   06/08/20                                      (11/02/19) ---------------------------------------------------------------------- Anatomy  Cranium:               Previously seen        LVOT:                   Not well visualized  Cavum:                 Previously seen        Aortic Arch:            Not well visualized  Ventricles:            Previously seen        Ductal Arch:            Not well visualized  Choroid Plexus:        Previously seen        Diaphragm:              Appears normal  Cerebellum:            Previously seen        Stomach:                Appears normal, left                                                                        sided  Posterior Fossa:       Previously seen        Abdomen:                Appears normal  Nuchal Fold:           Not applicable (>20    Abdominal Wall:         Not well visualized                         wks GA)  Face:                  Not well visualized    Cord Vessels:           Not well visualized  Lips:                  Previously seen        Kidneys:                Appear normal  Palate:                Not well visualized    Bladder:                Appears normal  Thoracic:  Previously seen        Spine:                  Previously seen  Heart:                 Previously seen        Upper Extremities:      Not well visualized  RVOT:                  Not well visualized    Lower Extremities:      Not well visualized  Other:  Technically difficult due to maternal habitus and fetal position. ---------------------------------------------------------------------- Cervix Uterus Adnexa  Cervix  Not visualized (advanced GA >24wks) ---------------------------------------------------------------------- Impression  Gestational diabetes.  Patient reports her fasting levels are  below 90 mg/DL and postprandial levels are in the 120s.  Amniotic fluid is normal and good fetal activity is seen  .Antenatal testing is reassuring. BPP 8/8.  I reassured the  patient of the findings.  Timing of delivery should be based on control of diabetes. ---------------------------------------------------------------------- Recommendations  -BPP and NST next week.  -Fetal growth assessment in 2 weeks. ----------------------------------------------------------------------                  Noralee Space, MD Electronically Signed Final Report   05/06/2020 08:28 am ----------------------------------------------------------------------  [place] MFM OB DETAIL +14 WK  Result Date: 04/29/2020 ----------------------------------------------------------------------  OBSTETRICS REPORT                       (Signed Final 04/29/2020 12:57 pm) ---------------------------------------------------------------------- Patient Info  ID #:       [id number]                          D.O.B.:  10-22-1995 (24 yrs)  Name:       [name]                    Visit Date: 04/29/2020 08:37 am ---------------------------------------------------------------------- Performed By  Attending:        Noralee Space MD        Ref. Address:     [address]                                                             [address]  Performed By:     Sandi Mealy        [facility]                                     [facility]                                                             [facility]  Referred By:      [facility] ---------------------------------------------------------------------- Orders  #  Description                           Code        Ordered By  1  [place] MFM OB DETAIL +14 WK               [id number]    Luna Kitchens ----------------------------------------------------------------------  #  Order #                     Accession #                Episode #  1  [id number]                   [id number]                 [id number]  ---------------------------------------------------------------------- Indications  Obesity complicating pregnancy, third          O99.213  trimester (BMI 48)  Encounter for antenatal screening for          Z36.3  malformations  [redacted] weeks gestation of pregnancy                Z3A.34  Gestational diabetes in pregnancy,             O24.419  unspecified control ---------------------------------------------------------------------- Fetal Evaluation  Num Of Fetuses:         1  Fetal Heart Rate(bpm):  141  Cardiac Activity:       Observed  Presentation:           Cephalic  Placenta:               Anterior  P. Cord Insertion:      Visualized  Amniotic Fluid  AFI FV:      Within normal limits  AFI Sum(cm)     %Tile       Largest Pocket(cm)  17.1            62          5.61  RUQ(cm)       RLQ(cm)       LUQ(cm)        LLQ(cm)  5.4           5.61          2.79           3.3 ---------------------------------------------------------------------- Biometry  BPD:      89.9  mm     G. Age:  36w 3d         94  %    CI:        77.61   %    70 - 86                                                          FL/HC:      21.2   %    19.4 - 21.8  HC:       323   mm     G. Age:  36w 4d         71  %    HC/AC:  0.99        0.96 - 1.11  AC:      324.8  mm     G. Age:  36w 3d         96  %    FL/BPD:     76.3   %    71 - 87  FL:       68.6  mm     G. Age:  35w 2d         66  %    FL/AC:      21.1   %    20 - 24  HUM:      62.6  mm     G. Age:  36w 2d       > 95  %  LV:        5.1  mm  Est. FW:    2852  gm      6 lb 5 oz     91  % ---------------------------------------------------------------------- OB History  Gravidity:    1         Term:   0 ---------------------------------------------------------------------- Gestational Age  U/S Today:     36w 1d                                        EDD:   05/26/20  Best:          34w 2d     Det. By:  Previous Ultrasound      EDD:   06/08/20                                      (11/02/19)  ---------------------------------------------------------------------- Anatomy  Cranium:               Appears normal         LVOT:                   Not well visualized  Cavum:                 Appears normal         Aortic Arch:            Not well visualized  Ventricles:            Appears normal         Ductal Arch:            Not well visualized  Choroid Plexus:        Appears normal         Diaphragm:              Appears normal  Cerebellum:            Appears normal         Stomach:                Appears normal, left  sided  Posterior Fossa:       Appears normal         Abdomen:                Appears normal  Nuchal Fold:           Not applicable (>20    Abdominal Wall:         Not well visualized                         wks GA)  Face:                  Not well visualized    Cord Vessels:           Not well visualized  Lips:                  Appears normal         Kidneys:                Appear normal  Palate:                Not well visualized    Bladder:                Appears normal  Thoracic:              Appears normal         Spine:                  Appears normal  Heart:                 Appears normal         Upper Extremities:      Not well visualized                         (4CH, axis, and                         situs)  RVOT:                  Not well visualized    Lower Extremities:      Not well visualized  Other:  Technically difficult due to maternal habitus and fetal position. ---------------------------------------------------------------------- Impression  G1 P0.  Patient transferred her prenatal care with an intention  to deliver at [facility] and [address], [place].  She has a recent diagnosis of gestational diabetes and has  not started checking her blood glucose.  She will be picking  up a glucometer from the pharmacy.  On cell-free fetal DNA screening, the risks of fetal  aneuploidies are not increased .   Midtrimester fetal anatomy  scan performed at your office was reported as normal.  On today's ultrasound, the estimated fetal weight is at the  91st percentile.  Amniotic fluid is normal good fetal activity  seen.  Fetal anatomical survey appears normal but limited by  advanced gestational age and maternal obesity.  Incidentally  observed antenatal testing is reassuring.  Maternal obesity imposes limitations on the resolution of  images, and failure to detect fetal anomalies is more common  in obese pregnant women.  I discussed the importance of checking blood glucose  measurements regularly.  Good control of diabetes prevents  adverse fetal or neonatal outcomes.  We will initiate weekly antenatal testing because of uncertain  control of  diabetes. ---------------------------------------------------------------------- Recommendations  -Weekly BPP till delivery.  -Fetal growth assessment in 3 weeks. ----------------------------------------------------------------------                  Noralee Space, MD Electronically Signed Final Report   04/29/2020 12:57 pm ----------------------------------------------------------------------  [place] MFM OB FOLLOW UP  Result Date: 05/20/2020 ----------------------------------------------------------------------  OBSTETRICS REPORT                       (Signed Final 05/20/2020 09:45 am) ---------------------------------------------------------------------- Patient Info  ID #:       [id number]                          D.O.B.:  February 02, 1996 (24 yrs)  Name:       [name]                    Visit Date: 05/20/2020 07:47 am ---------------------------------------------------------------------- Performed By  Attending:        Noralee Space MD        Ref. Address:     [address]                                                             [address]  Performed By:     Truitt Leep,      Location[facility]                                [facility]  Referred By:      [facility] ---------------------------------------------------------------------- Orders  #  Description                           Code        Ordered By  1  [place] MFM FETAL BPP                      40981.1     [facility]     W/NONSTRESS  2  [place] MFM OB FOLLOW UP                   91478.29    RAVI SHANKAR ----------------------------------------------------------------------  #  Order #  Accession #                Episode #  1  [id number]                   [id number]                 [id number]  2  [id number]                   [id number]                 [id number] ---------------------------------------------------------------------- Indications  Gestational diabetes in pregnancy,             O24.419  unspecified control  Obesity complicating pregnancy, third          O99.213  trimester (BMI 48)  Encounter for other antenatal screening        Z36.2  follow-up  [redacted] weeks gestation of pregnancy                Z3A.37 ---------------------------------------------------------------------- Fetal Evaluation  Num Of Fetuses:         1  Cardiac Activity:       Observed  Presentation:           Cephalic  Placenta:               Anterior  P. Cord Insertion:      Previously Visualized  Amniotic Fluid  AFI FV:      Within normal limits  AFI Sum(cm)     %Tile       Largest Pocket(cm)  18              69          7.46  RUQ(cm)       RLQ(cm)       LUQ(cm)        LLQ(cm)  1.51          3.75          7.46           5.29 ---------------------------------------------------------------------- Biophysical Evaluation  Amniotic F.V:   Pocket => 2 cm             F. Tone:        Observed  F. Movement:    Observed                   N.S.T:          Reactive  F. Breathing:   Observed                   Score:          10/10  ---------------------------------------------------------------------- Biometry  BPD:      94.4  mm     G. Age:  38w 3d         91  %    CI:        78.71   %    70 - 86                                                          FL/HC:      21.7   %    20.8 - 22.6  HC:      336.5  mm     G. Age:  38w 4d         57  %    HC/AC:      1.02        0.92 - 1.05  AC:      328.3  mm     G. Age:  36w 5d         49  %    FL/BPD:     77.4   %    71 - 87  FL:       73.1  mm     G. Age:  37w 3d         53  %    FL/AC:      22.3   %    20 - 24  Est. FW:    3174  gm           7 lb     59  % ---------------------------------------------------------------------- OB History  Blood Type:   O+  Gravidity:    1         Term:   0 ---------------------------------------------------------------------- Gestational Age  U/S Today:     37w 6d                                        EDD:   06/04/20  Best:          37w 2d     Det. By:  Previous Ultrasound      EDD:   06/08/20                                      (11/02/19) ---------------------------------------------------------------------- Anatomy  Cranium:               Appears normal         LVOT:                   Appears normal  Cavum:                 Appears normal         Aortic Arch:            Appears normal  Ventricles:            Appears normal         Ductal Arch:            Previously seen  Choroid Plexus:        Appears normal         Diaphragm:              Previously seen  Cerebellum:            Previously seen        Stomach:                Appears normal, left                                                                        sided  Posterior Fossa:  Previously seen        Abdomen:                Appears normal  Nuchal Fold:           Not applicable (>20    Abdominal Wall:         Appears nml (cord                         wks GA)                                        insert, abd wall)  Face:                  Appears normal         Cord Vessels:           Previously  seen                         (orbits and profile)  Lips:                  Appears normal         Kidneys:                Appear normal  Palate:                Appears normal         Bladder:                Appears normal  Thoracic:              Previously seen        Spine:                  Previously seen  Heart:                 Appears normal         Upper Extremities:      Not well visualized                         (4CH, axis, and                         situs)  RVOT:                  Appears normal         Lower Extremities:      Not well visualized  Other:  Technicallly difficult due to advanced GA and maternal habitus. ---------------------------------------------------------------------- Impression  Gestational diabetes.  Patient reports she is checking her  blood glucose regularly and they are within normal range.  Fetal growth is appropriate for gestational age .Amniotic fluid  is normal and good fetal activity is seen .  Antenatal testing is  reassuring.  NST is reactive.  BPP 10/10.  We reassured the patient of the findings. ---------------------------------------------------------------------- Recommendations  -BPP next week.  -Consider delivery at [redacted] weeks gestation. ----------------------------------------------------------------------                  Noralee Space, MD Electronically Signed Final Report   05/20/2020 09:45 am ----------------------------------------------------------------------   Assessment and Plan:  Pregnancy: G1P0 at [redacted]w[redacted]d 1. Diet controlled gestational diabetes mellitus (GDM) in  third trimester 2. [redacted] weeks gestation of pregnancy 3. Supervision of high risk pregnancy, antepartum Normal CBGs reported, did not bring log. Diet and exercise log.  IOL scheduled at 39 weeks on 06/01/2020. Offered outpatient foley placement for cervical ripening, she declined this. She is getting her pre-admission COVID screening soon. Labor symptoms and general obstetric precautions including but  not limited to vaginal bleeding, contractions, leaking of fluid and fetal movement were reviewed in detail with the patient. Please refer to After Visit Summary for other counseling recommendations.   Return in about 5 weeks (around 07/02/2020) for Postpartum check.  Future Appointments  Date Time Provider Department Center  05/30/2020  9:45 AM [facility]-SCREENING [facility] None  06/01/2020 12:00 AM [facility]-LD SCHED ROOM [facility] None    Jaynie Collins, MD

## 2020-05-28 NOTE — Patient Instructions (Signed)
OUTPATIENT FOLEY BULB INDUCTION OF LABOR:  Information Sheet for Mothers and Family               What's a Foley Bulb Induction? A Foley bulb induction is a procedure where your provider inserts a catheter into your cervix. Once inside your womb, your provider inflates the balloon with a saline solution.   This puts pressure on your cervix and encourages dilation. The catheter falls out once your cervix dilates to 3-4 centimeters.     With any procedure, it's important that you know what to expect. The insertion of a Foley catheter can be a bit uncomfortable, and some women experience sharp pelvic pain. The pain may subside once the catheter is in place. You may experience some cramping when the Foley catheter is in place.  This is normal.     GO TO THE MATERNITY ADMISSIONS UNIT FOR THE FOLLOWING:  Heavy vaginal bleeding  Rupture of membranes (fluid that wets your underwear)  Painful uterine contractions every 5 minutes or less  Severe abdominal discomfort  Decreased movement of the baby   Labor Induction  Labor induction is when steps are taken to cause a pregnant woman to begin the labor process. Most women go into labor on their own between 37 weeks and 42 weeks of pregnancy. When this does not happen or when there is a medical need for labor to begin, steps may be taken to induce labor. Labor induction causes a pregnant woman's uterus to contract. It also causes the cervix to soften (ripen), open (dilate), and thin out (efface). Usually, labor is not induced before 39 weeks of pregnancy unless there is a medical reason to do so. Your health care provider will determine if labor induction is needed. Before inducing labor, your health care provider will consider a number of factors, including:  Your medical condition and your baby's.  How many weeks along you are in your pregnancy.  How mature your baby's lungs are.  The condition of your cervix.  The position of your  baby.  The size of your birth canal. What are some reasons for labor induction? Labor may be induced if:  Your health or your baby's health is at risk.  Your pregnancy is overdue by 1 week or more.  Your water breaks but labor does not start on its own.  There is a low amount of amniotic fluid around your baby. You may also choose (elect) to have labor induced at a certain time. Generally, elective labor induction is done no earlier than 39 weeks of pregnancy. What methods are used for labor induction? Methods used for labor induction include:  Prostaglandin medicine. This medicine starts contractions and causes the cervix to dilate and ripen. It can be taken by mouth (orally) or by being inserted into the vagina (suppository).  Inserting a small, thin tube (catheter) with a balloon into the vagina and then expanding the balloon with water to dilate the cervix.  Stripping the membranes. In this method, your health care provider gently separates amniotic sac tissue from the cervix. This causes the cervix to stretch, which in turn causes the release of a hormone called progesterone. The hormone causes the uterus to contract. This procedure is often done during an office visit, after which you will be sent home to wait for contractions to begin.  Breaking the water. In this method, your health care provider uses a small instrument to make a small hole in the amniotic sac. This eventually causes   eventually causes the amniotic sac to break. Contractions should begin after a few hours.  Medicine to trigger or strengthen contractions. This medicine is given through an IV that is inserted into a vein in your arm. Except for membrane stripping, which can be done in a clinic, labor induction is done in the hospital so that you and your baby can be carefully monitored. How long does it take for labor to be induced? The length of time it takes to induce labor depends on how ready your body is for labor. Some  inductions can take up to 2-3 days, while others may take less than a day. Induction may take longer if:  You are induced early in your pregnancy.  It is your first pregnancy.  Your cervix is not ready. What are some risks associated with labor induction? Some risks associated with labor induction include:  Changes in fetal heart rate, such as being too high, too low, or irregular (erratic).  Failed induction.  Infection in the mother or the baby.  Increased risk of having a cesarean delivery.  Fetal death.  Breaking off (abruption) of the placenta from the uterus (rare).  Rupture of the uterus (very rare). When induction is needed for medical reasons, the benefits of induction generally outweigh the risks. What are some reasons for not inducing labor? Labor induction should not be done if:  Your baby does not tolerate contractions.  You have had previous surgeries on your uterus, such as a myomectomy, removal of fibroids, or a vertical scar from a previous cesarean delivery.  Your placenta lies very low in your uterus and blocks the opening of the cervix (placenta previa).  Your baby is not in a head-down position.  The umbilical cord drops down into the birth canal in front of the baby.  There are unusual circumstances, such as the baby being very early (premature).  You have had more than 2 previous cesarean deliveries. Summary  Labor induction is when steps are taken to cause a pregnant woman to begin the labor process.  Labor induction causes a pregnant woman's uterus to contract. It also causes the cervix to ripen, dilate, and efface.  Labor is not induced before 39 weeks of pregnancy unless there is a medical reason to do so.  When induction is needed for medical reasons, the benefits of induction generally outweigh the risks. This information is not intended to replace advice given to you by your health care provider. Make sure you discuss any questions you  have with your health care provider. Document Revised: 06/18/2017 Document Reviewed: 07/29/2016 Elsevier Patient Education  2020 [employer].

## 2020-05-29 DIAGNOSIS — Z419 Encounter for procedure for purposes other than remedying health state, unspecified: Secondary | ICD-10-CM | POA: Diagnosis not present

## 2020-05-30 ENCOUNTER — Other Ambulatory Visit (HOSPITAL_COMMUNITY)
Admission: RE | Admit: 2020-05-30 | Discharge: 2020-05-30 | Disposition: A | Discharge status: Home / Self Care | Payer: No Typology Code available for payment source | Source: Ambulatory Visit | Attending: Family Medicine | Admitting: Family Medicine

## 2020-05-30 DIAGNOSIS — Z20822 Contact with and (suspected) exposure to covid-19: Secondary | ICD-10-CM | POA: Insufficient documentation

## 2020-05-30 DIAGNOSIS — Z01812 Encounter for preprocedural laboratory examination: Secondary | ICD-10-CM | POA: Insufficient documentation

## 2020-05-30 LAB — SARS CORONAVIRUS 2 (TAT 6-24 HRS): SARS Coronavirus 2: NEGATIVE

## 2020-06-01 ENCOUNTER — Encounter (HOSPITAL_COMMUNITY): Payer: Self-pay | Admitting: Obstetrics and Gynecology

## 2020-06-01 ENCOUNTER — Inpatient Hospital Stay (HOSPITAL_COMMUNITY): Payer: Medicaid Other | Admitting: Anesthesiology

## 2020-06-01 ENCOUNTER — Inpatient Hospital Stay (HOSPITAL_COMMUNITY): Payer: Medicaid Other

## 2020-06-01 ENCOUNTER — Other Ambulatory Visit: Payer: Self-pay

## 2020-06-01 ENCOUNTER — Inpatient Hospital Stay (HOSPITAL_COMMUNITY)
Admission: AD | Admit: 2020-06-01 | Discharge: 2020-06-04 | DRG: 805 | Disposition: A | Discharge status: Home / Self Care | Payer: Medicaid Other | Attending: Obstetrics and Gynecology | Admitting: Obstetrics and Gynecology

## 2020-06-01 DIAGNOSIS — O0990 Supervision of high risk pregnancy, unspecified, unspecified trimester: Principal | ICD-10-CM

## 2020-06-01 DIAGNOSIS — Z3A39 39 weeks gestation of pregnancy: Secondary | ICD-10-CM | POA: Diagnosis not present

## 2020-06-01 DIAGNOSIS — O411290 Chorioamnionitis, unspecified trimester, not applicable or unspecified: Secondary | ICD-10-CM

## 2020-06-01 DIAGNOSIS — O99214 Obesity complicating childbirth: Secondary | ICD-10-CM | POA: Diagnosis not present

## 2020-06-01 DIAGNOSIS — O24420 Gestational diabetes mellitus in childbirth, diet controlled: Secondary | ICD-10-CM | POA: Diagnosis not present

## 2020-06-01 DIAGNOSIS — E669 Obesity, unspecified: Secondary | ICD-10-CM | POA: Diagnosis not present

## 2020-06-01 DIAGNOSIS — Z87891 Personal history of nicotine dependence: Secondary | ICD-10-CM

## 2020-06-01 DIAGNOSIS — O99210 Obesity complicating pregnancy, unspecified trimester: Secondary | ICD-10-CM | POA: Diagnosis present

## 2020-06-01 DIAGNOSIS — O24430 Gestational diabetes mellitus in the puerperium, diet controlled: Secondary | ICD-10-CM | POA: Diagnosis not present

## 2020-06-01 DIAGNOSIS — O9902 Anemia complicating childbirth: Secondary | ICD-10-CM | POA: Diagnosis present

## 2020-06-01 DIAGNOSIS — O24419 Gestational diabetes mellitus in pregnancy, unspecified control: Secondary | ICD-10-CM | POA: Diagnosis present

## 2020-06-01 DIAGNOSIS — Z6841 Body Mass Index (BMI) 40.0 and over, adult: Secondary | ICD-10-CM

## 2020-06-01 DIAGNOSIS — O24429 Gestational diabetes mellitus in childbirth, unspecified control: Secondary | ICD-10-CM | POA: Diagnosis not present

## 2020-06-01 DIAGNOSIS — O411230 Chorioamnionitis, third trimester, not applicable or unspecified: Secondary | ICD-10-CM | POA: Diagnosis not present

## 2020-06-01 DIAGNOSIS — O24410 Gestational diabetes mellitus in pregnancy, diet controlled: Secondary | ICD-10-CM | POA: Diagnosis present

## 2020-06-01 DIAGNOSIS — O99215 Obesity complicating the puerperium: Secondary | ICD-10-CM | POA: Diagnosis not present

## 2020-06-01 LAB — CBC
HCT: 37.3 % (ref 36.0–46.0)
Hemoglobin: 12.6 g/dL (ref 12.0–15.0)
MCH: 28.7 pg (ref 26.0–34.0)
MCHC: 33.8 g/dL (ref 30.0–36.0)
MCV: 85 fL (ref 80.0–100.0)
Platelets: 184 10*3/uL (ref 150–400)
RBC: 4.39 MIL/uL (ref 3.87–5.11)
RDW: 13.6 % (ref 11.5–15.5)
WBC: 11.6 10*3/uL — ABNORMAL HIGH (ref 4.0–10.5)
nRBC: 0 % (ref 0.0–0.2)

## 2020-06-01 LAB — GLUCOSE, CAPILLARY
Glucose-Capillary: 90 mg/dL (ref 70–99)
Glucose-Capillary: 82 mg/dL (ref 70–99)
Glucose-Capillary: 87 mg/dL (ref 70–99)
Glucose-Capillary: 62 mg/dL — ABNORMAL LOW (ref 70–99)
Glucose-Capillary: 62 mg/dL — ABNORMAL LOW (ref 70–99)
Glucose-Capillary: 62 mg/dL — ABNORMAL LOW (ref 70–99)
Glucose-Capillary: 83 mg/dL (ref 70–99)
Glucose-Capillary: 89 mg/dL (ref 70–99)
Glucose-Capillary: 99 mg/dL (ref 70–99)

## 2020-06-01 LAB — RPR: RPR Ser Ql: NONREACTIVE

## 2020-06-01 LAB — TYPE AND SCREEN
ABO/RH(D): O POS
Antibody Screen: NEGATIVE

## 2020-06-01 MED ORDER — FENTANYL-BUPIVACAINE-NACL 0.5-0.125-0.9 MG/250ML-% EP SOLN: 12.0000 mL/h | EPIDURAL | Status: DC | PRN

## 2020-06-01 MED ORDER — PHENYLEPHRINE 40 MCG/ML (10ML) SYRINGE FOR IV PUSH (FOR BLOOD PRESSURE SUPPORT)
80.0000 ug | PREFILLED_SYRINGE | INTRAVENOUS | Status: DC | PRN
  Filled 2020-06-01 (×2): qty 10

## 2020-06-01 MED ORDER — EPHEDRINE 5 MG/ML INJ: 10.0000 mg | INTRAVENOUS | Status: DC | PRN

## 2020-06-01 MED ORDER — MISOPROSTOL 50MCG HALF TABLET
ORAL_TABLET | ORAL | Status: AC
  Administered 2020-06-01: 50 ug via BUCCAL
  Filled 2020-06-01: qty 1

## 2020-06-01 MED ORDER — SODIUM CHLORIDE 0.9 % IV SOLN: 5.0000 10*6.[IU] | Freq: Once | INTRAVENOUS | Status: DC

## 2020-06-01 MED ORDER — LACTATED RINGERS IV SOLN
500.0000 mL | Freq: Once | INTRAVENOUS | Status: AC
  Administered 2020-06-01: 500 mL via INTRAVENOUS

## 2020-06-01 MED ORDER — PHENYLEPHRINE 40 MCG/ML (10ML) SYRINGE FOR IV PUSH (FOR BLOOD PRESSURE SUPPORT): 80.0000 ug | PREFILLED_SYRINGE | INTRAVENOUS | Status: DC | PRN

## 2020-06-01 MED ORDER — SODIUM CHLORIDE (PF) 0.9 % IJ SOLN
INTRAMUSCULAR | Status: DC | PRN
  Administered 2020-06-01: 12 mL/h via EPIDURAL

## 2020-06-01 MED ORDER — OXYTOCIN-SODIUM CHLORIDE 30-0.9 UT/500ML-% IV SOLN
1.0000 m[IU]/min | INTRAVENOUS | Status: DC
  Administered 2020-06-01: 2 m[IU]/min via INTRAVENOUS

## 2020-06-01 MED ORDER — PENICILLIN G POT IN DEXTROSE 60000 UNIT/ML IV SOLN: 3.0000 10*6.[IU] | INTRAVENOUS | Status: DC

## 2020-06-01 MED ORDER — OXYTOCIN-SODIUM CHLORIDE 30-0.9 UT/500ML-% IV SOLN
2.5000 [IU]/h | INTRAVENOUS | Status: DC
  Administered 2020-06-02: 2.5 [IU]/h via INTRAVENOUS
  Filled 2020-06-01 (×2): qty 500

## 2020-06-01 MED ORDER — LACTATED RINGERS IV SOLN: INTRAVENOUS | Status: DC

## 2020-06-01 MED ORDER — TERBUTALINE SULFATE 1 MG/ML IJ SOLN: 0.2500 mg | Freq: Once | INTRAMUSCULAR | Status: DC | PRN

## 2020-06-01 MED ORDER — GLUCOSE 40 % PO GEL
ORAL | Status: AC
  Administered 2020-06-01: 37.5 g
  Filled 2020-06-01: qty 1

## 2020-06-01 MED ORDER — LIDOCAINE HCL (PF) 1 % IJ SOLN: 30.0000 mL | INTRAMUSCULAR | Status: DC | PRN

## 2020-06-01 MED ORDER — SOD CITRATE-CITRIC ACID 500-334 MG/5ML PO SOLN: 30.0000 mL | ORAL | Status: DC | PRN

## 2020-06-01 MED ORDER — MISOPROSTOL 25 MCG QUARTER TABLET: 25.0000 ug | ORAL_TABLET | ORAL | Status: DC | PRN

## 2020-06-01 MED ORDER — MISOPROSTOL 50MCG HALF TABLET: 50.0000 ug | ORAL_TABLET | ORAL | Status: DC | PRN

## 2020-06-01 MED ORDER — FENTANYL-BUPIVACAINE-NACL 0.5-0.125-0.9 MG/250ML-% EP SOLN
EPIDURAL | Status: AC
  Filled 2020-06-01: qty 250

## 2020-06-01 MED ORDER — LACTATED RINGERS IV SOLN: 500.0000 mL | INTRAVENOUS | Status: DC | PRN

## 2020-06-01 MED ORDER — OXYTOCIN BOLUS FROM INFUSION
333.0000 mL | Freq: Once | INTRAVENOUS | Status: AC
  Administered 2020-06-02: 333 mL via INTRAVENOUS

## 2020-06-01 MED ORDER — LIDOCAINE HCL (PF) 1 % IJ SOLN
INTRAMUSCULAR | Status: DC | PRN
  Administered 2020-06-01: 5 mL via EPIDURAL
  Administered 2020-06-01: 3 mL via EPIDURAL
  Administered 2020-06-01: 2 mL via EPIDURAL

## 2020-06-01 MED ORDER — MISOPROSTOL 50MCG HALF TABLET
ORAL_TABLET | ORAL | Status: AC
  Filled 2020-06-01: qty 1

## 2020-06-01 MED ORDER — ACETAMINOPHEN 325 MG PO TABS
650.0000 mg | ORAL_TABLET | ORAL | Status: DC | PRN
  Administered 2020-06-01 – 2020-06-02 (×2): 650 mg via ORAL
  Filled 2020-06-01 (×2): qty 2

## 2020-06-01 MED ORDER — GLUCOSE 40 % PO GEL: 1.0000 | Freq: Once | ORAL | Status: DC

## 2020-06-01 MED ORDER — ONDANSETRON HCL 4 MG/2ML IJ SOLN
4.0000 mg | Freq: Four times a day (QID) | INTRAMUSCULAR | Status: DC | PRN
  Administered 2020-06-01 (×3): 4 mg via INTRAVENOUS
  Filled 2020-06-01 (×3): qty 2

## 2020-06-01 MED ORDER — DIPHENHYDRAMINE HCL 50 MG/ML IJ SOLN: 12.5000 mg | INTRAMUSCULAR | Status: DC | PRN

## 2020-06-01 MED ORDER — FENTANYL CITRATE (PF) 100 MCG/2ML IJ SOLN: 100.0000 ug | INTRAMUSCULAR | Status: DC | PRN

## 2020-06-01 MED ORDER — PHENYLEPHRINE 40 MCG/ML (10ML) SYRINGE FOR IV PUSH (FOR BLOOD PRESSURE SUPPORT)
80.0000 ug | PREFILLED_SYRINGE | INTRAVENOUS | Status: DC | PRN
  Administered 2020-06-01: 80 ug via INTRAVENOUS

## 2020-06-01 MED ORDER — FENTANYL CITRATE (PF) 100 MCG/2ML IJ SOLN
INTRAMUSCULAR | Status: AC
  Administered 2020-06-01: 100 ug
  Filled 2020-06-01: qty 2

## 2020-06-01 MED ORDER — FENTANYL CITRATE (PF) 100 MCG/2ML IJ SOLN
50.0000 ug | INTRAMUSCULAR | Status: DC | PRN
  Administered 2020-06-01: 50 ug via INTRAVENOUS
  Filled 2020-06-01: qty 2

## 2020-06-01 NOTE — Anesthesia Procedure Notes (Signed)
Epidural Patient location during procedure: OB Start time: 06/01/2020 1:00 PM End time: 06/01/2020 1:12 PM  Staffing Anesthesiologist: Marcene Duos, MD Performed: anesthesiologist   Preanesthetic Checklist Completed: patient identified, IV checked, site marked, risks and benefits discussed, surgical consent, monitors and equipment checked, pre-op evaluation and timeout performed  Epidural Patient position: sitting Prep: DuraPrep and site prepped and draped Patient monitoring: continuous pulse ox and blood pressure Approach: midline Location: L3-L4 Injection technique: LOR air  Needle:  Needle type: Tuohy  Needle gauge: 17 G Needle length: 9 cm and 9 Needle insertion depth: 9 cm Catheter type: closed end flexible Catheter size: 19 Gauge Catheter at skin depth: 17 cm Test dose: negative  Assessment Events: blood not aspirated, injection not painful, no injection resistance, no paresthesia and negative IV test

## 2020-06-01 NOTE — Progress Notes (Signed)
Labor Progress Note Nichole Carlson is a 24 y.o. G1P0 at [redacted]w[redacted]d presented for IOL 2/2 A1GDM and obesity.   S: Patient doing well. Resting comfortably in the bed.   O:  BP (!) 121/54   Pulse 70   Temp 97.8 F (36.6 C) (Oral)   Resp 18   Ht 5\' 9"  (1.753 m)   Wt (!) 153.9 kg   LMP 09/09/2019   BMI 50.09 kg/m  EFM: 130/moderate variability/+accels, no decels  CVE: Dilation: 3.5 Effacement (%): 60 Station: -2 Presentation: Vertex Exam by:: Dr. [email or web address]  A&P: 24 y.o. G1P0 [redacted]w[redacted]d for IOL 2/2 A1GDM and obesity  #Labor: Progressing well. S/p FB and cytotec. Will start pitocin and titrate to complete. Can consider AROM at next check.   #A1GDM: q4 CBG during latent labor and q2 during active. Initial CBG 90.  #Pain: prn IV pain meds #FWB: cat 1 #GBS negative  [redacted]w[redacted]d, MD 6:41 AM

## 2020-06-01 NOTE — Progress Notes (Addendum)
Nichole Carlson is a 24 y.o. G1P0 at [redacted]w[redacted]d.  Subjective: No improvement in contractions after 2 doses of Fentanyl. Requesting epidural.   Objective: BP 128/62   Pulse 68   Temp 97.8 F (36.6 C) (Oral)   Resp 18   Ht 5\' 9"  (1.753 m)   Wt (!) 153.9 kg   LMP 09/09/2019   BMI 50.09 kg/m    FHT:  FHR: 125 bpm, variability: mod,  accelerations:  15x15,  decelerations:  none UC:   Q 1-3 minutes, mod-strong Dilation: 5, asynclitic Effacement (%): 90 Station: -1, -2 Presentation: Vertex Exam by:: [email or web address], CNM IUPC and FSE placed w./out difficulty  Labs: NA  Assessment / Plan: [redacted]w[redacted]d week IUP Labor: IOL/Protracted latent phase. IUPC placed.  Fetal Wellbeing:  Category I Pain Control:  Requesting epidural Anticipated MOD:  SVD Will titrate pitocin to achieve adequate MVU's and CTO labor progress closely.   [redacted]w[redacted]d, Katrinka Blazing, CNM 06/01/2020 1:45 PM

## 2020-06-01 NOTE — Progress Notes (Signed)
Labor Progress Note Nichole Carlson is a 24 y.o. G1P0 at [redacted]w[redacted]d presented for IOL secondary to A1GDM.  S: Patient doing well and endorsing vaginal pressure, no concerns at this time.   O:  BP 134/75   Pulse 85   Temp 97.6 F (36.4 C) (Oral)   Resp 18   Ht 5\' 9"  (1.753 m)   Wt (!) 153.9 kg   LMP 09/09/2019   BMI 50.09 kg/m  EFM: 150bpm/minimal variability/mild accels without decels Contractions q2-3 min  CVE: Dilation: Lip/rim Effacement (%): 90 Station: Plus 1, Plus 2 Presentation: Vertex Exam by:: [email or web address], RN   A&P: 24 y.o. G1P0 [redacted]w[redacted]d presenting for IOL secondary to A1GDM. #Labor: Progressing well. Pitocin increased to 16 given slight presence of cervix anteriorly.  #Pain: epidural  #FWB: category 1 #GBS negative #A1GDM: Most recent CBG 99, continue to monitor.   [redacted]w[redacted]d, DO 10:53 PM

## 2020-06-01 NOTE — Progress Notes (Signed)
Labor Progress Note Nichole Carlson is a 24 y.o. G1P0 at [redacted]w[redacted]d presented for IOL 2/2 A1GDM and obesity.   S: Patient doing well   O:  BP 126/74   Pulse 80   Temp 97.8 F (36.6 C) (Oral)   Resp 18   Ht 5\' 9"  (1.753 m)   Wt (!) 153.9 kg   LMP 09/09/2019   BMI 50.09 kg/m  EFM: 130/moderate variability/+accels, no decels  CVE: Dilation: 3.5 Effacement (%): 60 Station: -2 Presentation: Vertex Exam by:: Dr. [email or web address]  A&P: 24 y.o. G1P0 [redacted]w[redacted]d for IOL 2/2 A1GDM and obesity  #Labor: Progressing well. S/p FB and cytotec. Pitocin started at 0645. SROM 0830.  Continue to titrate pitocin.   #A1GDM: Initial CBG 90, can defer further CBG at this time.   #Pain: prn IV pain meds #FWB: cat 1 #GBS negative  [redacted]w[redacted]d, MD 9:28 AM

## 2020-06-01 NOTE — H&P (Addendum)
OBSTETRIC ADMISSION HISTORY AND PHYSICAL  Nichole Carlson is a 24 y.o. female G1P0 with IUP at 38w0dby 8 week ultrasound presenting for IOL for A1GDM and obesity. Reports fasting glucose 70-90 and meal times 100-130. Highest glucose reading she has seen has been 160.  She reports +FMs, No LOF, no VB, no blurry vision, headaches or peripheral edema, and RUQ pain.  She plans on breast feeding. She is unsure what she plans to use for birth control. [facility]   Dating: 8 week ultrasound --->  Estimated Date of Delivery: 06/08/20  Sono:   05/20/2020 _0 , CWD, normal anatomy, cephalic presentation, [id number] 59% EFW  Prenatal History/Complications:  AM4QASObesity (BMI 50 on admission)  Past Medical History: Past Medical History:  Diagnosis Date   COVID-19    Family history of adverse reaction to anesthesia    mom had problems waking up   Gestational diabetes 04/19/2020   UTI (lower urinary tract infection)    Vertigo    Yeast infection     Past Surgical History: Past Surgical History:  Procedure Laterality Date   TONSILLECTOMY     TONSILLECTOMY     wisdonm teeth removed      Obstetrical History: OB History     Gravida  1   Para      Term      Preterm      AB      Living         SAB      TAB      Ectopic      Multiple      Live Births             Social History Social History   Socioeconomic History   Marital status: Single    Spouse name: Not on file   Number of children: Not on file   Years of education: Not on file   Highest education level: Not on file  Occupational History    Employer: [employer]  Tobacco Use   Smoking status: Former Smoker    Packs/day: 0.50    Types: Cigarettes   Smokeless tobacco: Never Used  VScientific laboratory technicianUse: Never used  Substance and Sexual Activity   Alcohol use: Not Currently    Comment: occasional   Drug use: Not Currently    Types: Marijuana    Comment: last smoked  saturday morning   Sexual activity: Not Currently  Other Topics Concern   Not on file  Social History Narrative   Not on file   Social Determinants of Health   Financial Resource Strain:    Difficulty of Paying Living Expenses: Not on file  Food Insecurity:    Worried About RCharity fundraiserin the Last Year: Not on file   RYRC Worldwideof Food in the Last Year: Not on file  Transportation Needs:    Lack of Transportation (Medical): Not on file   Lack of Transportation (Non-Medical): Not on file  Physical Activity:    Days of Exercise per Week: Not on file   Minutes of Exercise per Session: Not on file  Stress:    Feeling of Stress : Not on file  Social Connections:    Frequency of Communication with Friends and Family: Not on file   Frequency of Social Gatherings with Friends and Family: Not on file   Attends Religious Services: Not on file   Active Member of Clubs  or Organizations: Not on file   Attends Archivist Meetings: Not on file   Marital Status: Not on file    Family History: Family History  Problem Relation Age of Onset   Congestive Heart Failure Mother    COPD Mother    Diabetes Mother    Hypertension Mother    Gout Mother    Hypertension Father    Diabetes Father    Cancer Maternal Grandmother        skin   Breast cancer Maternal Grandmother 62   Allergies: Allergies  Allergen Reactions   Fish Allergy Anaphylaxis and Hives    Reaction to fish sticks    Latex Itching    Vaginal itching    Medications Prior to Admission  Medication Sig Dispense Refill Last Dose   Accu-Chek Softclix Lancets lancets 1 each by Other route 4 (four) times daily. 100 each 12    aspirin EC 81 MG tablet Take 1 tablet (81 mg total) by mouth daily. Take after 12 weeks for prevention of preeclampssia later in pregnancy 300 tablet 2    Blood Glucose Monitoring Suppl (ACCU-CHEK GUIDE) w/Device KIT 1 Device by Does not apply route 4 (four) times daily. 1 kit 0    glucose  blood (ACCU-CHEK GUIDE) test strip Use to check blood sugars four times a day was instructed 50 each 12    Prenatal Vit-Fe Fumarate-FA (PRENATAL VITAMINS PO) Take by mouth.      Prenatal Vit-Fe Fumarate-FA (PREPLUS) 27-1 MG TABS Take 1 tablet by mouth daily. 30 tablet 13    Review of Systems   All systems reviewed and negative except as stated in HPI  Blood pressure 115/84, pulse 87, temperature 97.8 F (36.6 C), temperature source Oral, resp. rate 16, last menstrual period 09/09/2019. General appearance: alert and no distress Lungs: no dyspnea Heart: regular rate Abdomen: soft, non-tender; gravid Pelvic: as stated below Extremities: Homans sign is negative, no sign of DVT Presentation: cephalic by u/s on L&D. Fetal monitoringBaseline: 135 bpm, Variability: Good {> 6 bpm), Accelerations: Reactive and Decelerations: Absent Uterine activity: intermittent    Prenatal labs: ABO, Rh: O/Positive/-- (05/24 1116) Antibody: Negative (05/24 1116) Rubella: 19.80 (05/24 1116) RPR: Non Reactive (10/20 0843)  HBsAg: Negative (05/24 1116)  HIV: Non Reactive (10/20 0825)  GBS: Negative/-- (11/23 1453)  2 hr Glucola 85/200/89 Genetic screening  normal Anatomy US normal  Prenatal Transfer Tool  Maternal Diabetes: Yes:  Diabetes Type:  Diet controlled Genetic Screening: Normal Maternal Ultrasounds/Referrals: Normal Fetal Ultrasounds or other Referrals:  None Maternal Substance Abuse:  No Significant Maternal Medications:  None  Significant Maternal Lab Results: Group B Strep negative  Patient Active Problem List   Diagnosis Date Noted   GDM (gestational diabetes mellitus), class A1 06/01/2020   BMI 50.0-59.9, adult ([place]) 05/20/2020   Gestational diabetes 04/19/2020   Supervision of high risk pregnancy, antepartum 71/69/6789   Obesity complicating peripregnancy, antepartum 03/19/2020   Assessment/Plan:  Nichole Carlson is a 24 y.o. G1P0 at 25w0dhere for IOL 2/2 to A1GDM and obesity.    #IOL: Cephalic by u/s on arrival to the floor. Based on SVE, will dose cytotec and place FB. Will plan to redose cytotec every 4 hours until FB falls out, then transition to pitocin.  #A1GDM: Failed 2h GTT. Patient states checking blood glucose regularly and they are within normal range. Initial CBG today is 90. Will plan to check CBG q4 hour during latent labor and q2h during active labor.  #Pain:  prn IV pain meds #FWB: Cat 1 #ID: GBS neg #MOF: Breast #MOC:undecided #Circ: yes  Layla Barter, MD  06/01/2020, 12:31 AM  Attestation of Supervision of Student:  I confirm that I have verified the information documented in the  resident's   note and that I have also personally reperformed the history, physical exam and all medical decision making activities.  I have verified that all services and findings are accurately documented in this student's note; and I agree with management and plan as outlined in the documentation. I have also made any necessary editorial changes.  Randa Ngo, [place] for [facility], [facility] 06/01/2020 2:50 AM

## 2020-06-01 NOTE — Progress Notes (Signed)
Nichole Carlson is a 24 y.o. G1P0 at [redacted]w[redacted]d.  Subjective: Pt reporting nausea, HA. Resolved w/ Zofran and Tx of Hypoglycemia and mild hypotension.   Now reporting pain in low back over past few contractions. Used PCA.   Objective: BP (!) 119/58   Pulse (!) 101   Temp 97.6 F (36.4 C) (Oral)   Resp 18   Ht 5\' 9"  (1.753 m)   Wt (!) 153.9 kg   LMP 09/09/2019   BMI 50.09 kg/m   Patient Vitals for the past 24 hrs:  BP Temp Temp src Pulse Resp Height Weight  06/01/20 1859 (!) 119/58 -- -- (!) 101 18 -- --  06/01/20 1830 (!) 114/49 97.6 F (36.4 C) Oral 79 18 -- --  06/01/20 1800 116/81 -- -- 82 18 -- --  06/01/20 1733 111/62 -- -- 73 18 -- --  06/01/20 1700 (!) 106/54 -- -- 69 16 -- --  06/01/20 1630 (!) 95/42 -- -- 72 16 -- --  06/01/20 1621 -- 98 F (36.7 C) Oral -- -- -- --  06/01/20 1600 124/70 -- -- 78 16 -- --  06/01/20 1530 (!) 125/52 -- -- 65 16 -- --  06/01/20 1500 131/62 -- -- 68 16 -- --  06/01/20 1430 128/62 -- -- 68 18 -- --  06/01/20 1400 129/70 -- -- 62 18 -- --  06/01/20 1356 (!) 123/94 -- -- (!) 107 18 -- --  06/01/20 1345 (!) 114/51 -- -- 64 18 -- --  06/01/20 1340 (!) 93/42 -- -- 86 18 -- --  06/01/20 1335 114/61 -- -- 66 18 -- --  06/01/20 1330 (!) 101/56 -- -- 69 18 -- --  06/01/20 1325 (!) 106/38 97.8 F (36.6 C) Oral 75 18 -- --  06/01/20 1320 (!) 125/51 -- -- 72 18 -- --  06/01/20 1315 (!) 130/91 -- -- (!) 104 18 -- --  06/01/20 1312 (!) 122/55 -- -- 74 18 -- --     FHT:  FHR: 135 bpm, variability: mod,  accelerations:  15x15,  decelerations:  Mild variables UC:   Q 1-3 minutes, MVU's 170-205 Dilation: 9 Effacement (%): 90 Station: -1 Presentation: Vertex Exam by:: [email or web address], CNM  Labs: CBG (last 3)  Recent Labs    06/01/20 1749 06/01/20 1802 06/01/20 1824  GLUCAP 62* 62* 83     Assessment / Plan: [redacted]w[redacted]d week IUP Labor: Transition Fetal Wellbeing:  Category I Pain Control:  Epidural Anticipated MOD:  SVD A1GDM: Hypoglycemia resolved.  Will check Q 2 hours.   [redacted]w[redacted]d, Katrinka Blazing, [place] 06/01/2020 7:16 PM

## 2020-06-01 NOTE — Progress Notes (Signed)
Hypoglycemic Event  CBG: 62   Treatment:  Apple juice and glucose gel  Symptoms: Pale, Shaky and nausea  Follow-up CBG: Time:1629  CBG Result:83  Possible Reasons for Event: Other: laboring - GDM  Comments/MD notified: Dorathy Kinsman, CNM     Yisroel Ramming

## 2020-06-01 NOTE — Anesthesia Preprocedure Evaluation (Signed)
Anesthesia Evaluation  Patient identified by MRN, date of birth, ID band Patient awake    Reviewed: Allergy & Precautions, Patient's Chart, lab work & pertinent test results  Airway Mallampati: III       Dental   Pulmonary former smoker,    breath sounds clear to auscultation       Cardiovascular negative cardio ROS   Rhythm:Regular Rate:Normal     Neuro/Psych negative neurological ROS     GI/Hepatic negative GI ROS, Neg liver ROS,   Endo/Other  diabetes, GestationalMorbid obesity  Renal/GU      Musculoskeletal   Abdominal   Peds  Hematology negative hematology ROS (+)   Anesthesia Other Findings   Reproductive/Obstetrics (+) Pregnancy                             Anesthesia Physical Anesthesia Plan  ASA: III  Anesthesia Plan: Epidural   Post-op Pain Management:    Induction:   PONV Risk Score and Plan: Treatment may vary due to age or medical condition  Airway Management Planned: Natural Airway  Additional Equipment:   Intra-op Plan:   Post-operative Plan:   Informed Consent: I have reviewed the patients History and Physical, chart, labs and discussed the procedure including the risks, benefits and alternatives for the proposed anesthesia with the patient or authorized representative who has indicated his/her understanding and acceptance.       Plan Discussed with:   Anesthesia Plan Comments:         Anesthesia Quick Evaluation

## 2020-06-01 NOTE — Progress Notes (Signed)
Nichole Carlson is a 24 y.o. G1P0 at [redacted]w[redacted]d.  Subjective: More uncomfortable w/UC's   Objective: BP 103/63   Pulse 84   Temp 98.2 F (36.8 C) (Oral)   Resp 18   Ht 5\' 9"  (1.753 m)   Wt (!) 153.9 kg   LMP 09/09/2019   BMI 50.09 kg/m    FHT:  FHR: 135 bpm, variability: mod,  accelerations:  15x15,  decelerations:  occasional variables and prolonged decel x 4 minutes.  UC:   Q 1-5 minutes, mod Dilation: 3.5 Effacement (%): 70 Station: -2 Presentation: Vertex Exam by:: [email or web address], CNM  SROM large amount of lear fluid at [place]: Results for orders placed or performed during the hospital encounter of 06/01/20 (from the past 24 hour(s))  CBC     Status: Abnormal   Collection Time: 06/01/20 12:35 AM  Result Value Ref Range   WBC 11.6 (H) 4.0 - 10.5 K/uL   RBC 4.39 3.87 - 5.11 MIL/uL   Hemoglobin 12.6 12.0 - 15.0 g/dL   HCT 14/04/21 36 - 46 %   MCV 85.0 80.0 - 100.0 fL   MCH 28.7 26.0 - 34.0 pg   MCHC 33.8 30.0 - 36.0 g/dL   RDW 03.4 74.2 - 59.5 %   Platelets 184 150 - 400 K/uL   nRBC 0.0 0.0 - 0.2 %  Type and screen     Status: None   Collection Time: 06/01/20 12:37 AM  Result Value Ref Range   ABO/RH(D) O POS    Antibody Screen NEG    Sample Expiration      06/04/2020,2359 Performed at [facility], [address] [place], [place]   Glucose, capillary     Status: None   Collection Time: 06/01/20  1:53 AM  Result Value Ref Range   Glucose-Capillary 90 70 - 99 mg/dL  Glucose, capillary     Status: None   Collection Time: 06/01/20  6:02 AM  Result Value Ref Range   Glucose-Capillary 82 70 - 99 mg/dL  Glucose, capillary     Status: None   Collection Time: 06/01/20  9:50 AM  Result Value Ref Range   Glucose-Capillary 87 70 - 99 mg/dL    Assessment / Plan: [redacted]w[redacted]d week IUP Labor: Early Fetal Wellbeing:  Category I-II, overall reassuring Pain Control:  Comfort measures Anticipated MOD:  SVD A1GDM: Nml CBGs. Will stop checking in labor per consult w/  Dr. [redacted]w[redacted]d. EFW 59% (3100 gm) at 37 weeks. CTO for protracted labor or concern for shoulder dystocia.   Vergie Living, Katrinka Blazing, CNM 06/01/2020 10:05 AM

## 2020-06-02 ENCOUNTER — Encounter (HOSPITAL_COMMUNITY): Payer: Self-pay | Admitting: Obstetrics and Gynecology

## 2020-06-02 DIAGNOSIS — O411290 Chorioamnionitis, unspecified trimester, not applicable or unspecified: Secondary | ICD-10-CM

## 2020-06-02 MED ORDER — SENNOSIDES-DOCUSATE SODIUM 8.6-50 MG PO TABS
2.0000 | ORAL_TABLET | ORAL | Status: DC
  Administered 2020-06-02 – 2020-06-03 (×2): 2 via ORAL
  Filled 2020-06-02 (×2): qty 2

## 2020-06-02 MED ORDER — TRANEXAMIC ACID-NACL 1000-0.7 MG/100ML-% IV SOLN
INTRAVENOUS | Status: AC
  Filled 2020-06-02: qty 100

## 2020-06-02 MED ORDER — DIBUCAINE (PERIANAL) 1 % EX OINT: 1.0000 "application " | TOPICAL_OINTMENT | CUTANEOUS | Status: DC | PRN

## 2020-06-02 MED ORDER — WITCH HAZEL-GLYCERIN EX PADS: 1.0000 "application " | MEDICATED_PAD | CUTANEOUS | Status: DC | PRN

## 2020-06-02 MED ORDER — ONDANSETRON HCL 4 MG PO TABS: 4.0000 mg | ORAL_TABLET | ORAL | Status: DC | PRN

## 2020-06-02 MED ORDER — COCONUT OIL OIL: 1.0000 "application " | TOPICAL_OIL | Status: DC | PRN

## 2020-06-02 MED ORDER — IBUPROFEN 600 MG PO TABS
600.0000 mg | ORAL_TABLET | Freq: Four times a day (QID) | ORAL | Status: DC
  Administered 2020-06-02 – 2020-06-04 (×10): 600 mg via ORAL
  Filled 2020-06-02 (×10): qty 1

## 2020-06-02 MED ORDER — MEASLES, MUMPS & RUBELLA VAC IJ SOLR: 0.5000 mL | Freq: Once | INTRAMUSCULAR | Status: DC

## 2020-06-02 MED ORDER — PRENATAL MULTIVITAMIN CH
1.0000 | ORAL_TABLET | Freq: Every day | ORAL | Status: DC
  Administered 2020-06-02 – 2020-06-04 (×3): 1 via ORAL
  Filled 2020-06-02 (×3): qty 1

## 2020-06-02 MED ORDER — SIMETHICONE 80 MG PO CHEW: 80.0000 mg | CHEWABLE_TABLET | ORAL | Status: DC | PRN

## 2020-06-02 MED ORDER — MAGNESIUM HYDROXIDE 400 MG/5ML PO SUSP: 30.0000 mL | ORAL | Status: DC | PRN

## 2020-06-02 MED ORDER — TRANEXAMIC ACID-NACL 1000-0.7 MG/100ML-% IV SOLN
1000.0000 mg | Freq: Once | INTRAVENOUS | Status: AC
  Administered 2020-06-02: 1000 mg via INTRAVENOUS

## 2020-06-02 MED ORDER — DIPHENHYDRAMINE HCL 25 MG PO CAPS: 25.0000 mg | ORAL_CAPSULE | Freq: Four times a day (QID) | ORAL | Status: DC | PRN

## 2020-06-02 MED ORDER — GENTAMICIN SULFATE 40 MG/ML IJ SOLN
5.0000 mg/kg | Freq: Once | INTRAVENOUS | Status: AC
  Administered 2020-06-02: 510 mg via INTRAVENOUS
  Filled 2020-06-02: qty 12.75

## 2020-06-02 MED ORDER — TETANUS-DIPHTH-ACELL PERTUSSIS 5-2.5-18.5 LF-MCG/0.5 IM SUSY: 0.5000 mL | PREFILLED_SYRINGE | Freq: Once | INTRAMUSCULAR | Status: DC

## 2020-06-02 MED ORDER — ONDANSETRON HCL 4 MG/2ML IJ SOLN: 4.0000 mg | INTRAMUSCULAR | Status: DC | PRN

## 2020-06-02 MED ORDER — SODIUM CHLORIDE 0.9 % IV SOLN
2.0000 g | Freq: Four times a day (QID) | INTRAVENOUS | Status: DC
  Administered 2020-06-02: 2 g via INTRAVENOUS
  Filled 2020-06-02: qty 2000

## 2020-06-02 MED ORDER — MISOPROSTOL 200 MCG PO TABS
1000.0000 ug | ORAL_TABLET | Freq: Once | ORAL | Status: AC
  Administered 2020-06-02: 1000 ug via RECTAL

## 2020-06-02 MED ORDER — BENZOCAINE-MENTHOL 20-0.5 % EX AERO
1.0000 "application " | INHALATION_SPRAY | CUTANEOUS | Status: DC | PRN
  Administered 2020-06-02: 1 via TOPICAL
  Filled 2020-06-02: qty 56

## 2020-06-02 MED ORDER — MISOPROSTOL 200 MCG PO TABS
ORAL_TABLET | ORAL | Status: AC
  Filled 2020-06-02: qty 5

## 2020-06-02 MED ORDER — ACETAMINOPHEN 325 MG PO TABS: 650.0000 mg | ORAL_TABLET | ORAL | Status: DC | PRN

## 2020-06-02 NOTE — Anesthesia Postprocedure Evaluation (Signed)
Anesthesia Post Note  Patient: Nichole Carlson  Procedure(s) Performed: AN AD HOC LABOR EPIDURAL     Patient location during evaluation: Mother Baby Anesthesia Type: Epidural Level of consciousness: awake and alert Pain management: pain level controlled Vital Signs Assessment: post-procedure vital signs reviewed and stable Respiratory status: spontaneous breathing Cardiovascular status: stable Postop Assessment: no headache, adequate PO intake, no backache, patient able to bend at knees, able to ambulate, epidural receding and no apparent nausea or vomiting Anesthetic complications: no   No complications documented.  Last Vitals:  Vitals:   06/02/20 0807 06/02/20 1126  BP: (!) 105/57 (!) 104/47  Pulse: 77 83  Resp: 20 18  Temp: 36.7 C 36.6 C  SpO2:      Last Pain:  Vitals:   06/02/20 1126  TempSrc:   PainSc: 0-No pain   Pain Goal:                Epidural/Spinal Function Cutaneous sensation: Normal sensation (06/02/20 1126)  [name], [place] P[place]

## 2020-06-02 NOTE — Discharge Summary (Addendum)
Postpartum Discharge Summary    Patient Name: Nichole Carlson DOB: March 04, 1996 MRN: [id number]  Date of admission: 06/01/2020 Delivery date:06/02/2020  Delivering provider: Randa Ngo  Date of discharge: 06/04/2020  Admitting diagnosis: GDM (gestational diabetes mellitus), class A1 [O24.410] Intrauterine pregnancy: [redacted]w[redacted]d    Secondary diagnosis:  Active Problems:   Obesity complicating peripregnancy, antepartum   Gestational diabetes   BMI 50.0-59.9, adult ([facility])   GDM (gestational diabetes mellitus), class A1   Vaginal delivery   Chorioamnionitis  Additional problems: as noted above  Discharge diagnosis: Vaginal delivery                                     Post partum procedures: none Augmentation: Pitocin, Cytotec and IP Foley Complications: chorioamnionitis (amp and gent prior to delivery)  Hospital course: Induction of Labor With Vaginal Delivery   24y.o. yo G1P1001 at 3104w1das admitted to the hospital 06/01/2020 for induction of labor.  Indication for induction: A1 DM.  Patient had a labor course complicated by chorioamnionitis (amp and gent prior to delivery). Labor course as follows: Membrane Rupture Time/Date: 8:30 AM ,06/01/2020   Delivery Method:Vaginal, Spontaneous  Episiotomy: None  Lacerations:  1st degree  Details of delivery can be found in separate delivery note.  Patient had a routine postpartum course. Patient is discharged home 06/04/20.  Newborn Data: Birth date:06/02/2020  Birth time:1:38 AM  Gender:Female  Living status:Living  Apgars:8 ,9  Weight:3294 g   Magnesium Sulfate received: No BMZ received: No Rhophylac:N/A MMR:N/A T-DaP:Given prenatally Flu: offered prior to discharge Transfusion:No  Physical exam  Vitals:   06/02/20 1126 06/02/20 2100 06/03/20 0553 06/03/20 2046  BP: (!) 104/47 115/70 110/60 129/71  Pulse: 83 85 75 84  Resp: '18 18 18 18  ' Temp: 97.8 F (36.6 C) 97.9 F (36.6 C) 98 F (36.7 C) 98.4 F (36.9 C)  TempSrc:  Oral  Oral Oral  SpO2:  100% 100% 100%  Weight:      Height:       General: alert, cooperative and no distress Lochia: appropriate Uterine Fundus: firm Incision: N/A DVT Evaluation: No evidence of DVT seen on physical exam. No cords or calf tenderness. No significant calf/ankle edema.  Labs: Lab Results  Component Value Date   WBC 19.0 (H) 06/03/2020   HGB 10.0 (L) 06/03/2020   HCT 31.3 (L) 06/03/2020   MCV 86.9 06/03/2020   PLT 167 06/03/2020   CMP Latest Ref Rng & Units 06/02/2019  Glucose 70 - 99 mg/dL 95  BUN 6 - 20 mg/dL 7  Creatinine 0.44 - 1.00 mg/dL 0.65  Sodium 135 - 145 mmol/L 142  Potassium 3.5 - 5.1 mmol/L 4.0  Chloride 98 - 111 mmol/L 105  CO2 22 - 32 mmol/L 27  Calcium 8.9 - 10.3 mg/dL 9.5  Total Protein 6.5 - 8.1 g/dL 7.2  Total Bilirubin 0.3 - 1.2 mg/dL 0.4  Alkaline Phos 38 - 126 U/L 92  AST 15 - 41 U/L 18  ALT 0 - 44 U/L 21   Edinburgh Score: Edinburgh Postnatal Depression Scale Screening Tool 06/03/2020  I have been able to laugh and see the funny side of things. 0  I have looked forward with enjoyment to things. 0  I have blamed myself unnecessarily when things went wrong. 1  I have been anxious or worried for no good reason. 1  I have felt scared or  panicky for no good reason. 0  Things have been getting on top of me. 1  I have been so unhappy that I have had difficulty sleeping. 1  I have felt sad or miserable. 1  I have been so unhappy that I have been crying. 0  The thought of harming myself has occurred to me. 0  Edinburgh Postnatal Depression Scale Total 5     After visit meds:  Allergies as of 06/04/2020       Reactions   Fish Allergy Anaphylaxis, Hives   Reaction to fish sticks   Latex Itching   Vaginal itching        Medication List     STOP taking these medications    Accu-Chek Guide test strip Generic drug: glucose blood   Accu-Chek Guide w/Device Kit   Accu-Chek Softclix Lancets lancets   aspirin EC 81 MG tablet        TAKE these medications    acetaminophen 500 MG tablet Commonly known as: TYLENOL Take 2 tablets (1,000 mg total) by mouth every 8 (eight) hours as needed for mild pain, moderate pain, fever or headache.   coconut oil Oil Apply 1 application topically as needed (nipple pain).   ibuprofen 800 MG tablet Commonly known as: ADVIL Take 1 tablet (800 mg total) by mouth every 8 (eight) hours as needed for moderate pain or cramping.   PRENATAL VITAMINS PO Take by mouth. What changed: Another medication with the same name was removed. Continue taking this medication, and follow the directions you see here.       Discharge home in stable condition Infant Feeding: Breast Infant Disposition:home with mother Discharge instruction: per After Visit Summary and Postpartum booklet. Activity: Advance as tolerated. Pelvic rest for 6 weeks.  Diet: routine diet Future Appointments: Future Appointments  Date Time Provider [place]  07/16/2020  8:30 AM Anyanwu, Sallyanne Havers, MD [facility]   Follow up Visit:  Message sent to [facility] on 06/02/20.  Please schedule this patient for a In person postpartum visit in 6 weeks with the following provider: Any provider. Additional Postpartum F/U:2 hour GTT in 6-8 weeks High risk pregnancy complicated by: [id number], morbid obesity, chorioamnionitis (amp & gent prior to delivery) Delivery mode:  Vaginal, Spontaneous  Anticipated Birth Control:  Unsure  06/04/2020 Layla Barter, MD  Attestation of Supervision of Student:  I confirm that I have verified the information documented in the  resident's  note and that I have also personally reperformed the history, physical exam and all medical decision making activities.  I have verified that all services and findings are accurately documented in this student's note; and I agree with management and plan as outlined in the documentation. I have also made any necessary editorial  changes.  Randa Ngo, [place] for [facility], [place] [facility] 06/04/2020 8:34 AM

## 2020-06-02 NOTE — Lactation Note (Signed)
This note was copied from a baby's chart. Lactation Consultation Note  Patient Name: Nichole Carlson Today's Date: 06/02/2020 Reason for consult: Initial assessment;Primapara;1st time breastfeeding;Term   P1 mother whose infant is now 35 hours old.  This is a term baby at 39+1 weeks.  Mother's feeding preference is breast/bottle.  Baby was quiet, awake and alert when I arrived.  RN in room providing medication.  Offered to assist with latching and mother agreeable.  Taught hand expression and mother was able to express one drop of colostrum which I finger fed to baby.  Assisted to latch to the left breast in the football hold.  However, once at the breast, baby was not interested in sucking.  Breast compressions and gentle stimulation demonstrated.  Placed him STS on mother's chest and he fell asleep.  Reviewed breast feeding basics with mother.  She will call for latch assistance as needed.  Mother does not have a DEBP for home use but will contact her insurance company after she decides on her pump choice.  Mom made aware of O/P services, breastfeeding support groups, community resources, and our phone # for post-discharge questions.  Family member present and supportive.  Rn updated.    Maternal Data Formula Feeding for Exclusion: Yes Reason for exclusion: Mother's choice to formula and breast feed on admission Has patient been taught Hand Expression?: Yes Does the patient have breastfeeding experience prior to this delivery?: No  Feeding Feeding Type: Breast Fed  LATCH Score Latch: Too sleepy or reluctant, no latch achieved, no sucking elicited.  Audible Swallowing: None  Type of Nipple: Everted at rest and after stimulation  Comfort (Breast/Nipple): Soft / non-tender  Hold (Positioning): Assistance needed to correctly position infant at breast and maintain latch.  LATCH Score: 5  Interventions Interventions: Breast feeding basics reviewed;Assisted with latch;Skin to  skin;Breast massage;Hand express;Breast compression;Adjust position;Position options;Support pillows  Lactation Tools Discussed/Used     Consult Status Consult Status: Follow-up Date: 06/03/20 Follow-up type: In-patient    [name] R [name] 06/02/2020, 11:45 AM

## 2020-06-02 NOTE — Discharge Instructions (Signed)

## 2020-06-02 NOTE — Lactation Note (Signed)
This note was copied from a baby's chart. Lactation Consultation Note  Patient Name: Nichole Carlson [id number] Date: 06/02/2020 Reason for consult: Follow-up assessment   LC Follow Up Visit:  RN requested latch assistance.  Baby was sleeping in mother's arms when I arrived.  Offered to assist with latching and mother agreeable.  Since he was not showing any feeding cues I removed his shirt and assisted him to awaken.  Allowed him to suck on my gloved finger prior to latching; suck is tight.  Attempted to latch in the football hold on the left breast, however, baby was not interested in opening his mouth.  He gazed and mother, quiet and content with no feeding cues.  Gentle stimulation provided but he was still not interested.  Placed him STS on mother's chest where he lay calmly.  Provided a manual pump with instructions for use.  Suggested mother pre-pump prior to latching to help evert nipples.  Also encouraged continued practice with hand expression to obtain colostrum drops.  Mother verbalized understanding.  Mother will call for assistance as needed when he awakens.   Maternal Data    Feeding Feeding Type: Breast Fed  LATCH Score Latch: Too sleepy or reluctant, no latch achieved, no sucking elicited.  Audible Swallowing: None  Type of Nipple: Everted at rest and after stimulation (short shafted)  Comfort (Breast/Nipple): Soft / non-tender  Hold (Positioning): Assistance needed to correctly position infant at breast and maintain latch.  LATCH Score: 5  Interventions Interventions: Breast feeding basics reviewed;Assisted with latch;Skin to skin;Hand express;Adjust position;Position options;Support pillows  Lactation Tools Discussed/Used     Consult Status Consult Status: Follow-up Date: 06/03/20 Follow-up type: In-patient    Dora Sims 06/02/2020, 6:45 PM

## 2020-06-03 LAB — CBC
HCT: 31.3 % — ABNORMAL LOW (ref 36.0–46.0)
Hemoglobin: 10 g/dL — ABNORMAL LOW (ref 12.0–15.0)
MCH: 27.8 pg (ref 26.0–34.0)
MCHC: 31.9 g/dL (ref 30.0–36.0)
MCV: 86.9 fL (ref 80.0–100.0)
Platelets: 167 10*3/uL (ref 150–400)
RBC: 3.6 MIL/uL — ABNORMAL LOW (ref 3.87–5.11)
RDW: 13.9 % (ref 11.5–15.5)
WBC: 19 10*3/uL — ABNORMAL HIGH (ref 4.0–10.5)
nRBC: 0 % (ref 0.0–0.2)

## 2020-06-03 LAB — GLUCOSE, CAPILLARY: Glucose-Capillary: 65 mg/dL — ABNORMAL LOW (ref 70–99)

## 2020-06-03 MED ORDER — ACETAMINOPHEN 500 MG PO TABS
1000.0000 mg | ORAL_TABLET | Freq: Four times a day (QID) | ORAL | Status: DC
  Administered 2020-06-04 (×3): 1000 mg via ORAL
  Filled 2020-06-03 (×3): qty 2

## 2020-06-03 MED ORDER — OXYCODONE HCL 5 MG PO TABS
5.0000 mg | ORAL_TABLET | Freq: Once | ORAL | Status: AC
  Administered 2020-06-03: 5 mg via ORAL
  Filled 2020-06-03: qty 1

## 2020-06-03 MED ORDER — ACETAMINOPHEN 500 MG PO TABS
1000.0000 mg | ORAL_TABLET | Freq: Four times a day (QID) | ORAL | Status: DC | PRN
  Administered 2020-06-03: 1000 mg via ORAL
  Filled 2020-06-03: qty 2

## 2020-06-03 MED ORDER — ACETAMINOPHEN 500 MG PO TABS: 1000.0000 mg | ORAL_TABLET | Freq: Three times a day (TID) | ORAL | Status: DC

## 2020-06-03 NOTE — Progress Notes (Addendum)
POSTPARTUM PROGRESS NOTE  Subjective: Nichole Carlson is a 24 y.o. G1P1001 s/p SVD at [redacted]w[redacted]d.  She reports she doing well. No acute events overnight. She denies any problems with ambulating, voiding or po intake. Denies nausea or vomiting. She has passed flatus. Pain is well controlled.  Lochia is mild.  Objective: Blood pressure 115/70, pulse 85, temperature 97.9 F (36.6 C), temperature source Oral, resp. rate 18, height 5\' 9"  (1.753 m), weight (!) 153.9 kg, last menstrual period 09/09/2019, SpO2 100 %, unknown if currently breastfeeding.  Physical Exam:  General: alert, cooperative and no distress Chest: no respiratory distress Abdomen: soft, non-tender  Uterine Fundus: firm and at level of umbilicus Extremities: No calf swelling or tenderness, no LE edema noted bilaterally   Recent Labs    06/01/20 0035 06/03/20 0402  HGB 12.6 10.0*  HCT 37.3 31.3*    Assessment/Plan: Nichole Carlson is a 24 y.o. G1P1001 s/p SVD at [redacted]w[redacted]d for IOL secondary to A1GDM.  Routine Postpartum Care: Doing well, pain well-controlled. -- Continue routine care, lactation support  -- Contraception: undecided -- Feeding: breast --A1GDM: PPD#1 FBG 65, patient asymptomatic. Nurse provided juice and crackers. Recheck BG as clinically indicated. --Chorioamnionitis: Patient noted to have a temperature of 101.5 during labor. Received ampcillin and gentamycin prior to delivery. Patient has remained afebrile since then with most recent temp 97.9. --Anemia: Hgb 12.6>10. Asymptomatic otherwise. No additional iron supplementation needed at this time.  Dispo: Plan for discharge tomorrow.  [redacted]w[redacted]d, DO 06/03/2020 6:50 AM  Attestation of Supervision of Student:  I confirm that I have verified the information documented in the  resident's  note and that I have also personally reperformed the history, physical exam and all medical decision making activities.  I have verified that all services and findings are accurately  documented in this student's note; and I agree with management and plan as outlined in the documentation. I have also made any necessary editorial changes.  14/11/2019, MD [facility], [facility] 06/03/2020 7:25 AM

## 2020-06-03 NOTE — Progress Notes (Signed)
Patients blood sugar was 65. Notified Orest Dikes Resident . Patient is showing no s/s of hypoglycemia. Gave patient orange juice ,crakers and peanut butter. Dr. Robyne Peers states to continue to monitor the patient. States that no other blood sugars are necessary at this time.

## 2020-06-03 NOTE — Social Work (Addendum)
CSW received consult for hx of marijuana use.  Referral was screened out due to the following:  ~MOB had no documented substance use after initial prenatal visit/+UPT. Initial visit noted on 6/1.  ~MOB had no positive drug screens after initial prenatal visit/+UPT. ~Baby's UDS is negative.  Please contact the Clinical Social Worker if needs arise, by [facility] request, or if MOB scores greater than 9/yes to question 10 on Edinburgh Postpartum Depression Screen.  Manfred Arch, LCSWA Clinical Social Work [employer] and [employer]  [phone]

## 2020-06-03 NOTE — Lactation Note (Addendum)
This note was copied from a baby's chart. Lactation Consultation Note  Patient Name: Nichole Carlson [id number] Date: 06/03/2020 Reason for consult: Follow-up assessment;Mother's request;Difficult latch;Primapara;1st time breastfeeding;Term;Infant weight loss;Maternal endocrine disorder (Gestational Hypertension) Type of Endocrine Disorder?: Diabetes (Gestational Diabetes)  Infant 39 weeks 54 hours old. Mom having trouble getting infant to latch for any extended period of time over 5-6 minutes. Mom's nipples are large and she states latch is better using 24 NS. Infant started on DBM at 3 pm feedings since his feedings at the breast have been short.  Mom using manual pump 3 x yesterday and 1 x today.   LC did breast massage and hand expression and colostrum noted. LC attempted to latch infant at the breast but unsuccessful. Infant just fed DBM just before visit.   LC sent Mom up on DEBP and got her pumping q 3 hours for 15 minutes to increase stimulation and let down. RN to check if there is enough DBM for her to supplement with overnight. Mom did pump and was able to collect 2 ml earlier today.   Plan 1. To feed based on cues 8-12x in 24 hour period no more than 4 hours without at attempt. Mom to offer both breasts first then use 24 NS.          2. Infant supplemented with yellow slow flow nipple with paced bottle feeding of EBM or DBM based on breastfeeding supplementation guideline and hours of age since birth.         3. DEBP q 3hours for 15 minutes.          4. I and O sheet reviewed.           5. Mom to call RN or LC for assistance with next latch.    Infant had 1 stool but no urine since midnight.   Mom able to pump 25 ml of EBM and will provide that with next feeding.  LC alerted RN, Harrie Foreman, infant has not voided since midnight after verifying it with family members.

## 2020-06-04 DIAGNOSIS — E669 Obesity, unspecified: Secondary | ICD-10-CM

## 2020-06-04 DIAGNOSIS — O24430 Gestational diabetes mellitus in the puerperium, diet controlled: Secondary | ICD-10-CM

## 2020-06-04 DIAGNOSIS — O99215 Obesity complicating the puerperium: Secondary | ICD-10-CM

## 2020-06-04 MED ORDER — IBUPROFEN 800 MG PO TABS: 800.0000 mg | ORAL_TABLET | Freq: Three times a day (TID) | ORAL | 0 refills | Status: DC | PRN

## 2020-06-04 MED ORDER — ACETAMINOPHEN 500 MG PO TABS: 1000.0000 mg | ORAL_TABLET | Freq: Three times a day (TID) | ORAL | 0 refills | Status: DC | PRN

## 2020-06-04 MED ORDER — COCONUT OIL OIL
1.0000 | TOPICAL_OIL | 0 refills | Status: DC | PRN
Start: 2020-06-04 — End: 2021-12-12

## 2020-06-29 DIAGNOSIS — Z419 Encounter for procedure for purposes other than remedying health state, unspecified: Secondary | ICD-10-CM | POA: Diagnosis not present

## 2020-07-04 ENCOUNTER — Ambulatory Visit: Payer: Medicaid Other | Admitting: Obstetrics and Gynecology

## 2020-07-16 ENCOUNTER — Ambulatory Visit: Payer: Medicaid Other | Admitting: Obstetrics & Gynecology

## 2020-07-23 ENCOUNTER — Ambulatory Visit: Payer: Medicaid Other | Admitting: Obstetrics and Gynecology

## 2020-07-30 DIAGNOSIS — Z419 Encounter for procedure for purposes other than remedying health state, unspecified: Secondary | ICD-10-CM | POA: Diagnosis not present

## 2020-08-13 DIAGNOSIS — H5213 Myopia, bilateral: Secondary | ICD-10-CM | POA: Diagnosis not present

## 2020-08-27 DIAGNOSIS — Z419 Encounter for procedure for purposes other than remedying health state, unspecified: Secondary | ICD-10-CM | POA: Diagnosis not present

## 2020-08-29 ENCOUNTER — Telehealth: Payer: Self-pay | Admitting: Licensed Clinical Social Worker

## 2020-08-29 NOTE — Telephone Encounter (Signed)
Patient referred by Ephriam Knuckles from [facility] via voicemail for positive Nichole Carlson.

## 2020-09-18 ENCOUNTER — Other Ambulatory Visit: Payer: Self-pay

## 2020-09-18 ENCOUNTER — Emergency Department
Admission: EM | Admit: 2020-09-18 | Discharge: 2020-09-18 | Disposition: A | Discharge status: Home / Self Care | Payer: No Typology Code available for payment source | Attending: Emergency Medicine | Admitting: Emergency Medicine

## 2020-09-18 DIAGNOSIS — R197 Diarrhea, unspecified: Secondary | ICD-10-CM | POA: Diagnosis not present

## 2020-09-18 DIAGNOSIS — Z91040 Latex allergy status: Secondary | ICD-10-CM | POA: Diagnosis not present

## 2020-09-18 DIAGNOSIS — Z8616 Personal history of COVID-19: Secondary | ICD-10-CM | POA: Insufficient documentation

## 2020-09-18 DIAGNOSIS — Z20822 Contact with and (suspected) exposure to covid-19: Secondary | ICD-10-CM | POA: Insufficient documentation

## 2020-09-18 DIAGNOSIS — R109 Unspecified abdominal pain: Secondary | ICD-10-CM | POA: Insufficient documentation

## 2020-09-18 DIAGNOSIS — R112 Nausea with vomiting, unspecified: Secondary | ICD-10-CM | POA: Diagnosis present

## 2020-09-18 DIAGNOSIS — Z87891 Personal history of nicotine dependence: Secondary | ICD-10-CM | POA: Insufficient documentation

## 2020-09-18 LAB — URINALYSIS, COMPLETE (UACMP) WITH MICROSCOPIC
Bilirubin Urine: NEGATIVE
Glucose, UA: NEGATIVE mg/dL
Hgb urine dipstick: NEGATIVE
Ketones, ur: NEGATIVE mg/dL
Leukocytes,Ua: NEGATIVE
Nitrite: NEGATIVE
Protein, ur: NEGATIVE mg/dL
Specific Gravity, Urine: 1.024 (ref 1.005–1.030)
pH: 8 (ref 5.0–8.0)

## 2020-09-18 LAB — CBC
HCT: 41.2 % (ref 36.0–46.0)
Hemoglobin: 13.2 g/dL (ref 12.0–15.0)
MCH: 27.6 pg (ref 26.0–34.0)
MCHC: 32 g/dL (ref 30.0–36.0)
MCV: 86.2 fL (ref 80.0–100.0)
Platelets: 200 10*3/uL (ref 150–400)
RBC: 4.78 MIL/uL (ref 3.87–5.11)
RDW: 13.6 % (ref 11.5–15.5)
WBC: 9.5 10*3/uL (ref 4.0–10.5)
nRBC: 0 % (ref 0.0–0.2)

## 2020-09-18 LAB — COMPREHENSIVE METABOLIC PANEL
ALT: 29 U/L (ref 0–44)
AST: 19 U/L (ref 15–41)
Albumin: 4 g/dL (ref 3.5–5.0)
Alkaline Phosphatase: 129 U/L — ABNORMAL HIGH (ref 38–126)
Anion gap: 7 (ref 5–15)
BUN: 9 mg/dL (ref 6–20)
CO2: 25 mmol/L (ref 22–32)
Calcium: 8.6 mg/dL — ABNORMAL LOW (ref 8.9–10.3)
Chloride: 107 mmol/L (ref 98–111)
Creatinine, Ser: 0.66 mg/dL (ref 0.44–1.00)
GFR, Estimated: >60 mL/min (ref >60)
Glucose, Bld: 98 mg/dL (ref 70–99)
Potassium: 3.9 mmol/L (ref 3.5–5.1)
Sodium: 139 mmol/L (ref 135–145)
Total Bilirubin: 0.6 mg/dL (ref 0.3–1.2)
Total Protein: 7.5 g/dL (ref 6.5–8.1)

## 2020-09-18 LAB — LIPASE, BLOOD: Lipase: 28 U/L (ref 11–51)

## 2020-09-18 LAB — POC URINE PREG, ED: Preg Test, Ur: NEGATIVE

## 2020-09-18 MED ORDER — HALOPERIDOL LACTATE 5 MG/ML IJ SOLN
5.0000 mg | Freq: Once | INTRAMUSCULAR | Status: AC
  Administered 2020-09-18: 5 mg via INTRAVENOUS
  Filled 2020-09-18: qty 1

## 2020-09-18 MED ORDER — DICYCLOMINE HCL 10 MG PO CAPS: 10.0000 mg | ORAL_CAPSULE | Freq: Four times a day (QID) | ORAL | 0 refills | Status: DC

## 2020-09-18 MED ORDER — SODIUM CHLORIDE 0.9 % IV BOLUS
1000.0000 mL | Freq: Once | INTRAVENOUS | Status: AC
  Administered 2020-09-18: 1000 mL via INTRAVENOUS

## 2020-09-18 MED ORDER — ONDANSETRON HCL 4 MG PO TABS: 4.0000 mg | ORAL_TABLET | Freq: Three times a day (TID) | ORAL | 0 refills | Status: DC | PRN

## 2020-09-18 NOTE — ED Provider Notes (Signed)
[facility] Emergency Department Provider Note   ____________________________________________   I have reviewed the triage vital signs and the nursing notes.   HISTORY  Chief Complaint Nausea and Diarrhea   History limited by: Not Limited   HPI Nichole Carlson is a 25 y.o. female who presents to the emergency department today because of concern for nausea vomiting and diarrhea. The patient states that the symptoms started this morning. Was feeling well yesterday. No unusual ingestions. No known sick contacts. Has had multiple episodes of non bloody diarrhea. Is having abdominal discomfort with this in the "pit" of her stomach. The patient denies any fevers. Denies any history of abdominal issues in the past.     Records reviewed. Per medical record review patient has a history of COVID  Past Medical History:  Diagnosis Date  . COVID-19   . Family history of adverse reaction to anesthesia    mom had problems waking up  . Gestational diabetes 04/19/2020  . UTI (lower urinary tract infection)   . Vertigo   . Yeast infection     Patient Active Problem List   Diagnosis Date Noted  . Vaginal delivery 06/02/2020  . Chorioamnionitis 06/02/2020  . GDM (gestational diabetes mellitus), class A1 06/01/2020  . BMI 50.0-59.9, adult ([place]) 05/20/2020  . Gestational diabetes 04/19/2020  . Supervision of high risk pregnancy, antepartum 03/19/2020  . Obesity complicating peripregnancy, antepartum 03/19/2020    Past Surgical History:  Procedure Laterality Date  . TONSILLECTOMY    . TONSILLECTOMY    . wisdonm teeth removed      Prior to Admission medications   Medication Sig Start Date End Date Taking? Authorizing Provider  acetaminophen (TYLENOL) 500 MG tablet Take 2 tablets (1,000 mg total) by mouth every 8 (eight) hours as needed for mild pain, moderate pain, fever or headache. 06/04/20   Layla Barter, MD  coconut oil OIL Apply 1 application topically as  needed (nipple pain). 06/04/20   Layla Barter, MD  ibuprofen (ADVIL) 800 MG tablet Take 1 tablet (800 mg total) by mouth every 8 (eight) hours as needed for moderate pain or cramping. 06/04/20   Layla Barter, MD  Prenatal Vit-Fe Fumarate-FA (PRENATAL VITAMINS PO) Take by mouth.    [provider]    Allergies Fish allergy and Latex  Family History  Problem Relation Age of Onset  . Congestive Heart Failure Mother   . COPD Mother   . Diabetes Mother   . Hypertension Mother   . Gout Mother   . Hypertension Father   . Diabetes Father   . Cancer Maternal Grandmother        skin  . Breast cancer Maternal Grandmother 73    Social History Social History   Tobacco Use  . Smoking status: Former Smoker    Packs/day: 0.50    Types: Cigarettes  . Smokeless tobacco: Never Used  Vaping Use  . Vaping Use: Never used  Substance Use Topics  . Alcohol use: Not Currently    Comment: occasional  . Drug use: Not Currently    Review of Systems Constitutional: No fever/chills Eyes: No visual changes. ENT: No sore throat. Cardiovascular: Denies chest pain. Respiratory: Denies shortness of breath. Gastrointestinal: Positive for abdominal pain, nausea, vomiting, diarrhea.   Genitourinary: Negative for dysuria. Musculoskeletal: Negative for back pain. Skin: Negative for rash. Neurological: Negative for headaches, focal weakness or numbness.  ____________________________________________   PHYSICAL EXAM:  VITAL SIGNS: ED Triage Vitals  Enc Vitals  Group     BP 09/18/20 1713 125/80     Pulse Rate 09/18/20 1713 75     Resp 09/18/20 1713 18     Temp 09/18/20 1713 98.5 F (36.9 C)     Temp Source 09/18/20 1713 Oral     SpO2 09/18/20 1713 98 %     Weight 09/18/20 1721 (!) 320 lb (145.2 kg)     Height 09/18/20 1721 '5\' 9"'  (1.753 m)     Head Circumference --      Peak Flow --      Pain Score 09/18/20 1721 7   Constitutional: Alert and oriented.  Eyes: Conjunctivae  are normal.  ENT      Head: Normocephalic and atraumatic.      Nose: No congestion/rhinnorhea.      Mouth/Throat: Mucous membranes are moist.      Neck: No stridor. Hematological/Lymphatic/Immunilogical: No cervical lymphadenopathy. Cardiovascular: Normal rate, regular rhythm.  No murmurs, rubs, or gallops.  Respiratory: Normal respiratory effort without tachypnea nor retractions. Breath sounds are clear and equal bilaterally. No wheezes/rales/rhonchi. Gastrointestinal: Soft and non tender. No rebound. No guarding.  Genitourinary: Deferred Musculoskeletal: Normal range of motion in all extremities. No lower extremity edema. Neurologic:  Normal speech and language. No gross focal neurologic deficits are appreciated.  Skin:  Skin is warm, dry and intact. No rash noted. Psychiatric: Mood and affect are normal. Speech and behavior are normal. Patient exhibits appropriate insight and judgment.  ____________________________________________    LABS (pertinent positives/negatives)  Upreg negative Lipase 28 CBC wbc 9.5, hgb 13.2, plt 200 CMP wnl except ca 8.6, alk phos 129 UA hazy, rare bacteria, otherwise unremarkable.   ____________________________________________   EKG  None  ____________________________________________    RADIOLOGY  None  ____________________________________________   PROCEDURES  Procedures  ____________________________________________   INITIAL IMPRESSION / ASSESSMENT AND PLAN / ED COURSE  Pertinent labs & imaging results that were available during my care of the patient were reviewed by me and considered in my medical decision making (see chart for details).   Patient presented to the emergency department today because of concerns for nausea vomiting and diarrhea.  On exam patient's abdomen is nontender.  Blood work without any significant leukocytosis.  At this time I think likely patient suffering from gastroenteritis.  I have less concern for  any focal abdominal infection.  Patient was given fluids and medication and did feel improvement.  Will plan on discharging with antinausea and Bentyl.  Discussed return precautions.  ____________________________________________   FINAL CLINICAL IMPRESSION(S) / ED DIAGNOSES  Final diagnoses:  Nausea vomiting and diarrhea     Note: This dictation was prepared with Dragon dictation. Any transcriptional errors that result from this process are unintentional     Nance Pear, MD 09/18/20 2203

## 2020-09-18 NOTE — ED Notes (Signed)
This RN went to discharge pt. Pt stated she had already removed her IV and was dressed waiting to leave.   Discharge instructions given to pt at this time.

## 2020-09-18 NOTE — Discharge Instructions (Addendum)
Please seek medical attention for any high fevers, chest pain, shortness of breath, change in behavior, persistent vomiting, bloody stool or any other new or concerning symptoms.  

## 2020-09-18 NOTE — ED Triage Notes (Signed)
Pt to ER via POV with complaints of nausea, vomiting, and diarrhea that started today. Pt reports mid abdominal pain unrelieved by rest, constant and does not radiate.   Denies fevers or chills at home. Denies any known covid contacts.

## 2020-09-19 ENCOUNTER — Telehealth: Payer: Self-pay

## 2020-09-19 LAB — SARS CORONAVIRUS 2 (TAT 6-24 HRS): SARS Coronavirus 2: NEGATIVE

## 2020-09-19 NOTE — Telephone Encounter (Signed)
Transition Care Management Unsuccessful Follow-up Telephone Call  Date of discharge and from where:  03/232022 from [facility].  Attempts:  1st Attempt  Reason for unsuccessful TCM follow-up call:  Left voice message     

## 2020-09-20 NOTE — Telephone Encounter (Signed)
Transition Care Management Unsuccessful Follow-up Telephone Call  Date of discharge and from where:  09/18/2020 from [facility]   Attempts:  2nd Attempt  Reason for unsuccessful TCM follow-up call:  Left voice message    

## 2020-09-23 NOTE — Telephone Encounter (Signed)
Transition Care Management Unsuccessful Follow-up Telephone Call  Date of discharge and from where:  09/18/2020 [facility] ED  Attempts:  3rd Attempt  Reason for unsuccessful TCM follow-up call:  Voice mail full

## 2020-09-26 ENCOUNTER — Telehealth: Payer: Self-pay

## 2020-09-26 NOTE — Telephone Encounter (Signed)
After receiving an ER referral, called pt. LVM 

## 2020-09-27 DIAGNOSIS — Z419 Encounter for procedure for purposes other than remedying health state, unspecified: Secondary | ICD-10-CM | POA: Diagnosis not present

## 2020-10-27 DIAGNOSIS — Z419 Encounter for procedure for purposes other than remedying health state, unspecified: Secondary | ICD-10-CM | POA: Diagnosis not present

## 2020-11-15 ENCOUNTER — Ambulatory Visit: Payer: Medicaid Other | Admitting: Physician Assistant

## 2020-11-15 ENCOUNTER — Encounter: Payer: Self-pay | Admitting: Physician Assistant

## 2020-11-15 ENCOUNTER — Other Ambulatory Visit: Payer: Self-pay

## 2020-11-15 DIAGNOSIS — Z202 Contact with and (suspected) exposure to infections with a predominantly sexual mode of transmission: Secondary | ICD-10-CM | POA: Diagnosis not present

## 2020-11-15 DIAGNOSIS — Z113 Encounter for screening for infections with a predominantly sexual mode of transmission: Secondary | ICD-10-CM

## 2020-11-15 LAB — WET PREP FOR TRICH, YEAST, CLUE
Trichomonas Exam: NEGATIVE
Yeast Exam: NEGATIVE

## 2020-11-15 MED ORDER — AZITHROMYCIN 500 MG PO TABS
1000.0000 mg | ORAL_TABLET | Freq: Once | ORAL | Status: AC
  Administered 2020-11-15: 1000 mg via ORAL

## 2020-11-15 NOTE — Progress Notes (Signed)
Pt here for STD screening.  Wet mount results reviewed.  Azithromycin 1 gm given po per Providers orders. Berdie Ogren, RN

## 2020-11-15 NOTE — Progress Notes (Signed)
[facility] Department STI clinic/screening visit  Subjective:  Nichole Carlson is a 25 y.o. female being seen today for an STI screening visit. The patient reports they do have symptoms.  Patient reports that they are not sure if they desire a pregnancy in the next year.   They reported they are not interested in discussing contraception today.  No LMP recorded.   Patient has the following medical conditions:   Patient Active Problem List   Diagnosis Date Noted  . Vaginal delivery 06/02/2020  . Chorioamnionitis 06/02/2020  . GDM (gestational diabetes mellitus), class A1 06/01/2020  . BMI 50.0-59.9, adult ([facility]) 05/20/2020  . Gestational diabetes 04/19/2020  . Supervision of high risk pregnancy, antepartum 03/19/2020  . Obesity complicating peripregnancy, antepartum 03/19/2020    Chief Complaint  Patient presents with  . SEXUALLY TRANSMITTED DISEASE    screening     HPI  Patient reports that she has had a "heavy" green discharge for 2 weeks with abdominal cramping and that she is also a contact to Chlamydia.  Denies chronic conditions and regular medicines.  Reports last HIV test was 4 months ago, last pap was in 2021, LMP 11/04/2020 and is using condoms sometimes as her BCM.      See flowsheet for further details and programmatic requirements.    The following portions of the patient's history were reviewed and updated as appropriate: allergies, current medications, past medical history, past social history, past surgical history and problem list.  Objective:  There were no vitals filed for this visit.  Physical Exam Constitutional:      General: She is not in acute distress.    Appearance: Normal appearance.  HENT:     Head: Normocephalic and atraumatic.     Comments: No nits,lice, or hair loss. No cervical, supraclavicular or axillary adenopathy.    Mouth/Throat:     Mouth: Mucous membranes are moist.     Pharynx: Oropharynx is clear. No oropharyngeal exudate  or posterior oropharyngeal erythema.  Eyes:     Conjunctiva/sclera: Conjunctivae normal.  Pulmonary:     Effort: Pulmonary effort is normal.  Abdominal:     Palpations: Abdomen is soft. There is no mass.     Tenderness: There is no abdominal tenderness. There is no guarding or rebound.  Genitourinary:    General: Normal vulva.     Rectum: Normal.     Comments: External genitalia/pubic area without nits, lice, edema, erythema, lesions and inguinal adenopathy. Vagina with normal mucosa and discharge. Cervix without visible lesions. Uterus firm, mobile, nt, no masses, no CMT, no adnexal tenderness or fullness. Musculoskeletal:     Cervical back: Neck supple. No tenderness.  Skin:    General: Skin is warm and dry.     Findings: No bruising, erythema, lesion or rash.  Neurological:     Mental Status: She is alert and oriented to person, place, and time.  Psychiatric:        Mood and Affect: Mood normal.        Behavior: Behavior normal.        Thought Content: Thought content normal.        Judgment: Judgment normal.      Assessment and Plan:  Nichole Carlson is a 25 y.o. female presenting to the [facility] for STI screening  1. Screening for STD (sexually transmitted disease) Patient into clinic with symptoms. Rec condoms with all sex. Await test results.  Counseled that RN will call if needs to RTC  for treatment once results are back. - WET PREP FOR TRICH, YEAST, CLUE - Gonococcus culture - Chlamydia/Gonorrhea [place] [facility] - HIV [place] [facility] - Syphilis Serology,  Lab  2. Chlamydia contact Will treat as a contact to Chlamydia with Azithromycin 1 g po DOT today. No sex for 7 days and until after partner completes treatment. RTC for re-treatment if vomits < 2 hr after taking medicine for re-treatment. Enc to use OTC antifungal cream if has itching after antibiotic use. - azithromycin (ZITHROMAX) tablet 1,000 mg     No follow-ups on  file.  No future appointments.  Matt Holmes, PA

## 2020-11-20 LAB — GONOCOCCUS CULTURE

## 2020-11-20 LAB — HM HIV SCREENING LAB: HM HIV Screening: NEGATIVE

## 2020-11-27 DIAGNOSIS — Z419 Encounter for procedure for purposes other than remedying health state, unspecified: Secondary | ICD-10-CM | POA: Diagnosis not present

## 2020-12-27 DIAGNOSIS — Z419 Encounter for procedure for purposes other than remedying health state, unspecified: Secondary | ICD-10-CM | POA: Diagnosis not present

## 2021-01-27 DIAGNOSIS — Z419 Encounter for procedure for purposes other than remedying health state, unspecified: Secondary | ICD-10-CM | POA: Diagnosis not present

## 2021-02-27 DIAGNOSIS — Z419 Encounter for procedure for purposes other than remedying health state, unspecified: Secondary | ICD-10-CM | POA: Diagnosis not present

## 2021-03-29 DIAGNOSIS — Z419 Encounter for procedure for purposes other than remedying health state, unspecified: Secondary | ICD-10-CM | POA: Diagnosis not present

## 2021-04-29 DIAGNOSIS — Z419 Encounter for procedure for purposes other than remedying health state, unspecified: Secondary | ICD-10-CM | POA: Diagnosis not present

## 2021-05-29 DIAGNOSIS — Z419 Encounter for procedure for purposes other than remedying health state, unspecified: Secondary | ICD-10-CM | POA: Diagnosis not present

## 2021-06-05 ENCOUNTER — Emergency Department: Payer: Worker's Compensation

## 2021-06-05 ENCOUNTER — Emergency Department
Admission: EM | Admit: 2021-06-05 | Discharge: 2021-06-05 | Disposition: A | Discharge status: Home / Self Care | Payer: Worker's Compensation | Attending: Student in an Organized Health Care Education/Training Program | Admitting: Student in an Organized Health Care Education/Training Program

## 2021-06-05 ENCOUNTER — Other Ambulatory Visit: Payer: Self-pay

## 2021-06-05 DIAGNOSIS — S39012A Strain of muscle, fascia and tendon of lower back, initial encounter: Secondary | ICD-10-CM | POA: Insufficient documentation

## 2021-06-05 DIAGNOSIS — Y990 Civilian activity done for income or pay: Secondary | ICD-10-CM | POA: Insufficient documentation

## 2021-06-05 DIAGNOSIS — Z91040 Latex allergy status: Secondary | ICD-10-CM | POA: Diagnosis not present

## 2021-06-05 DIAGNOSIS — R102 Pelvic and perineal pain: Secondary | ICD-10-CM | POA: Diagnosis not present

## 2021-06-05 DIAGNOSIS — Z87891 Personal history of nicotine dependence: Secondary | ICD-10-CM | POA: Diagnosis not present

## 2021-06-05 DIAGNOSIS — Z8616 Personal history of COVID-19: Secondary | ICD-10-CM | POA: Diagnosis not present

## 2021-06-05 DIAGNOSIS — W19XXXA Unspecified fall, initial encounter: Secondary | ICD-10-CM

## 2021-06-05 DIAGNOSIS — W010XXA Fall on same level from slipping, tripping and stumbling without subsequent striking against object, initial encounter: Secondary | ICD-10-CM | POA: Insufficient documentation

## 2021-06-05 DIAGNOSIS — S3992XA Unspecified injury of lower back, initial encounter: Secondary | ICD-10-CM | POA: Diagnosis present

## 2021-06-05 DIAGNOSIS — S7001XA Contusion of right hip, initial encounter: Secondary | ICD-10-CM | POA: Insufficient documentation

## 2021-06-05 DIAGNOSIS — M545 Low back pain, unspecified: Secondary | ICD-10-CM | POA: Diagnosis not present

## 2021-06-05 DIAGNOSIS — M549 Dorsalgia, unspecified: Secondary | ICD-10-CM | POA: Diagnosis not present

## 2021-06-05 DIAGNOSIS — Y9389 Activity, other specified: Secondary | ICD-10-CM | POA: Insufficient documentation

## 2021-06-05 LAB — POC URINE PREG, ED: Preg Test, Ur: NEGATIVE

## 2021-06-05 MED ORDER — MELOXICAM 15 MG PO TABS: 15.0000 mg | ORAL_TABLET | Freq: Every day | ORAL | 0 refills | Status: DC

## 2021-06-05 MED ORDER — KETOROLAC TROMETHAMINE 30 MG/ML IJ SOLN
30.0000 mg | Freq: Once | INTRAMUSCULAR | Status: AC
  Administered 2021-06-05: 30 mg via INTRAMUSCULAR
  Filled 2021-06-05: qty 1

## 2021-06-05 MED ORDER — ORPHENADRINE CITRATE 30 MG/ML IJ SOLN
60.0000 mg | Freq: Once | INTRAMUSCULAR | Status: AC
  Administered 2021-06-05: 60 mg via INTRAMUSCULAR
  Filled 2021-06-05: qty 2

## 2021-06-05 MED ORDER — METHOCARBAMOL 500 MG PO TABS: 500.0000 mg | ORAL_TABLET | Freq: Four times a day (QID) | ORAL | 0 refills | Status: DC

## 2021-06-05 NOTE — ED Notes (Signed)
Presents with c/o of back pain and R leg pain since fall at work this morning. Nichole Carlson states she was coming out of the cooler at work, slipped and fell on her butt, feet out in front of her. Did not hit head or back. States pain is "straight up and down my spine." . R leg feels "tight". No at home medications taken PTA. Denies numbness, tingling, loss of bowel or bladder control.

## 2021-06-05 NOTE — ED Provider Notes (Signed)
[facility] Emergency Department Provider Note  ____________________________________________  Time seen: Approximately 3:21 PM  I have reviewed the triage vital signs and the nursing notes.   HISTORY  Chief Complaint Fall and Back Pain    HPI Nichole Carlson is a 25 y.o. female who presents the emergency department complaining of lower back, right hip, right upper leg pain.  Patient states that she was exiting the walk and cooler at work, slipped and fell doing a "butt buster move."  She did not hit her head or lose consciousness.  Patient is currently complaining of back pain and radiating into the right hip and upper leg.  Patient is still ambulatory.  She has no history of chronic back issues, no previous injuries to the hip or leg.  Patient denies any numbness or tingling in either lower extremity, bowel or bladder incontinence, saddle anesthesia or paresthesias.       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 Authorizing Provider  meloxicam (MOBIC) 15 MG tablet Take 1 tablet (15 mg total) by mouth daily. 06/05/21  Yes Dacotah Cabello, Delorise Royals, PA-C  methocarbamol (ROBAXIN) 500 MG tablet Take 1 tablet (500  mg total) by mouth 4 (four) times daily. 06/05/21  Yes Chancy Smigiel, Delorise Royals, PA-C  acetaminophen (TYLENOL) 500 MG tablet Take 2 tablets (1,000 mg total) by mouth every 8 (eight) hours as needed for mild pain, moderate pain, fever or headache. Patient not taking: Reported on 11/15/2020 06/04/20   Trula Slade, MD  coconut oil OIL Apply 1 application topically as needed (nipple pain). Patient not taking: Reported on 11/15/2020 06/04/20   Trula Slade, MD  dicyclomine (BENTYL) 10 MG capsule Take 1 capsule (10 mg total) by mouth 4 (four) times daily for 14 days. 09/18/20 10/02/20  Phineas Semen, MD  ibuprofen (ADVIL) 800 MG tablet Take 1 tablet (800 mg total) by mouth every 8 (eight) hours as needed for moderate pain or cramping. Patient not taking: Reported on 11/15/2020 06/04/20   Trula Slade, MD  ondansetron (ZOFRAN) 4 MG tablet Take 1 tablet (4 mg total) by mouth every 8 (eight) hours as needed. Patient not taking: Reported on 11/15/2020 09/18/20   Phineas Semen, MD  Prenatal Vit-Fe Fumarate-FA (PRENATAL VITAMINS PO) Take by mouth.    [provider]    Allergies Fish allergy and Latex  Family History  Problem Relation Age of Onset   Congestive Heart Failure Mother    COPD Mother    Diabetes Mother    Hypertension Mother    Gout Mother    Hypertension Father    Diabetes Father    Cancer Maternal Grandmother  skin   Breast cancer Maternal Grandmother 39    Social History Social History   Tobacco Use   Smoking status: Former    Packs/day: 0.50    Types: Cigarettes   Smokeless tobacco: Never  Vaping Use   Vaping Use: Never used  Substance Use Topics   Alcohol use: Not Currently    Comment: occasional   Drug use: Not Currently    Types: Marijuana     Review of Systems  Constitutional: No fever/chills Eyes: No visual changes. No discharge ENT: No upper respiratory complaints. Cardiovascular: no chest pain. Respiratory: no cough. No  SOB. Gastrointestinal: No abdominal pain.  No nausea, no vomiting.  No diarrhea.  No constipation. Musculoskeletal: Positive for lower back, right hip, right leg pain Skin: Negative for rash, abrasions, lacerations, ecchymosis. Neurological: Negative for headaches, focal weakness or numbness.  10 System ROS otherwise negative.  ____________________________________________   PHYSICAL EXAM:  VITAL SIGNS: ED Triage Vitals [06/05/21 1347]  Enc Vitals Group     BP 119/89     Pulse Rate 68     Resp 16     Temp 98.6 F (37 C)     Temp Source Oral     SpO2 99 %     Weight (!) 325 lb (147.4 kg)     Height 5\' 9"  (1.753 m)     Head Circumference      Peak Flow      Pain Score 8     Pain Loc      Pain Edu?      Excl. in GC?      Constitutional: Alert and oriented. Well appearing and in no acute distress. Eyes: Conjunctivae are normal. PERRL. EOMI. Head: Atraumatic. ENT:      Ears:       Nose: No congestion/rhinnorhea.      Mouth/Throat: Mucous membranes are moist.  Neck: No stridor.    Cardiovascular: Normal rate, regular rhythm. Normal S1 and S2.  Good peripheral circulation. Respiratory: Normal respiratory effort without tachypnea or retractions. Lungs CTAB. Good air entry to the bases with no decreased or absent breath sounds. Musculoskeletal: Full range of motion to all extremities. No gross deformities appreciated.  Visualization of the lumbar spine reveals no visible signs of trauma.  No frank tenderness to palpation over the lumbar spine, SI joint or sciatic notch.  Patient has some mild tenderness along the lateral aspect of the right hip but no palpable abnormality.  No shortening or rotation of the right lower extremity.  No other tenderness to the right leg.  Patient is ambulatory without difficulty at this time.  Pulses sensation intact bilateral lower extremities Neurologic:  Normal speech and language. No gross focal neurologic deficits are appreciated.  Skin:  Skin is  warm, dry and intact. No rash noted. Psychiatric: Mood and affect are normal. Speech and behavior are normal. Patient exhibits appropriate insight and judgement.   ____________________________________________   LABS (all labs ordered are listed, but only abnormal results are displayed)  Labs Reviewed  POC URINE PREG, ED   ____________________________________________  EKG   ____________________________________________  RADIOLOGY I personally viewed and evaluated these images as part of my medical decision making, as well as reviewing the written report by the radiologist.  ED Provider Interpretation: No acute traumatic findings on x-ray of the lumbar spine, pelvis, femur  DG Lumbar Spine 2-3 Views  Result Date: 06/05/2021 CLINICAL DATA:  Fall, low back pain EXAM: LUMBAR SPINE - 2-3 VIEW COMPARISON:  None. FINDINGS: Markedly limited evaluation due to overlapping osseous structures and overlying soft tissues. Five non-rib-bearing lumbar vertebral bodies. There is no evidence of lumbar spine fracture. Alignment is normal. Intervertebral disc spaces are maintained. IMPRESSION: Negative. Electronically Signed   By: Tish Frederickson M.D.   On: 06/05/2021 16:50   DG Pelvis 1-2 Views  Result Date: 06/05/2021 CLINICAL DATA:  Fall.  Pain EXAM: PELVIS - 1-2 VIEW COMPARISON:  None. FINDINGS: Markedly limited evaluation due to overlapping osseous structures and overlying soft tissues. There is no evidence of pelvic fracture or diastasis. No hip dislocation bilaterally. No pelvic bone lesions are seen. IMPRESSION: Negative. Electronically Signed   By: Tish Frederickson M.D.   On: 06/05/2021 16:51   DG Femur Min 2 Views Right  Result Date: 06/05/2021 CLINICAL DATA:  Fall with back pain EXAM: RIGHT FEMUR 2 VIEWS COMPARISON:  None. FINDINGS: There is no evidence of fracture or other focal bone lesions. Soft tissues are unremarkable. IMPRESSION: Negative. Electronically Signed   By: Jasmine Pang M.D.    On: 06/05/2021 16:51    ____________________________________________    PROCEDURES  Procedure(s) performed:    Procedures    Medications  ketorolac (TORADOL) 30 MG/ML injection 30 mg (has no administration in time range)  orphenadrine (NORFLEX) injection 60 mg (has no administration in time range)     ____________________________________________   INITIAL IMPRESSION / ASSESSMENT AND PLAN / ED COURSE  Pertinent labs & imaging results that were available during my care of the patient were reviewed by me and considered in my medical decision making (see chart for details).  Review of the [place] CSRS was performed in accordance of the [facility] prior to dispensing any controlled drugs.           Patient's diagnosis is consistent with fall, lumbar strain, no contusion.  Patient presents emergency department after slipping and falling.  She landed on her buttocks but is having low back, hip and leg pain.  Imaging is reassuring with no acute traumatic findings.  Patiently placed on symptom control medications at home.  Follow-up primary care as needed.. . Patient is given ED precautions to return to the ED for any worsening or new symptoms.     ____________________________________________  FINAL CLINICAL IMPRESSION(S) / ED DIAGNOSES  Final diagnoses:  Fall, initial encounter  Strain of lumbar region, initial encounter  Contusion of right hip, initial encounter      NEW MEDICATIONS STARTED DURING THIS VISIT:  ED Discharge Orders          Ordered    meloxicam (MOBIC) 15 MG tablet  Daily        06/05/21 1734    methocarbamol (ROBAXIN) 500 MG tablet  4 times daily        06/05/21 1734                This chart was dictated using voice recognition software/Dragon. Despite best efforts to proofread, errors can occur which can change the meaning. Any change was purely unintentional.    Racheal Patches, PA-C 06/05/21 1734    Willy Eddy, MD 06/05/21  2201

## 2021-06-05 NOTE — ED Notes (Signed)
First Nurse Note:  Pt states that she fell at work. Pt is having pain in her back and right leg. Pt is filing workers comp. Pt is in NAD.

## 2021-06-05 NOTE — ED Triage Notes (Signed)
Pt reports that she slipped at work and fell into a sitting position, states that she is having back pain and pain in her right leg

## 2021-06-06 ENCOUNTER — Telehealth: Payer: Self-pay

## 2021-06-06 NOTE — Telephone Encounter (Signed)
Transition Care Management Unsuccessful Follow-up Telephone Call  Date of discharge and from where:  06/05/2021-[facility]  Attempts:  1st Attempt  Reason for unsuccessful TCM follow-up call:  Unable to reach patient

## 2021-06-09 NOTE — Telephone Encounter (Signed)
Transition Care Management Unsuccessful Follow-up Telephone Call  Date of discharge and from where:  06/05/2021 from [facility]  Attempts:  2nd Attempt  Reason for unsuccessful TCM follow-up call:  Unable to leave message

## 2021-06-10 NOTE — Telephone Encounter (Signed)
Transition Care Management Unsuccessful Follow-up Telephone Call  Date of discharge and from where:  06/05/2021 from [facility]  Attempts:  3rd Attempt  Reason for unsuccessful TCM follow-up call:  Unable to reach patient

## 2021-06-29 DIAGNOSIS — Z419 Encounter for procedure for purposes other than remedying health state, unspecified: Secondary | ICD-10-CM | POA: Diagnosis not present

## 2021-07-30 DIAGNOSIS — Z419 Encounter for procedure for purposes other than remedying health state, unspecified: Secondary | ICD-10-CM | POA: Diagnosis not present

## 2021-08-27 DIAGNOSIS — Z419 Encounter for procedure for purposes other than remedying health state, unspecified: Secondary | ICD-10-CM | POA: Diagnosis not present

## 2021-09-21 ENCOUNTER — Other Ambulatory Visit: Payer: Self-pay

## 2021-09-21 ENCOUNTER — Encounter: Payer: Self-pay | Admitting: Emergency Medicine

## 2021-09-21 DIAGNOSIS — Z5321 Procedure and treatment not carried out due to patient leaving prior to being seen by health care provider: Secondary | ICD-10-CM | POA: Insufficient documentation

## 2021-09-21 DIAGNOSIS — N898 Other specified noninflammatory disorders of vagina: Secondary | ICD-10-CM | POA: Insufficient documentation

## 2021-09-21 DIAGNOSIS — R059 Cough, unspecified: Secondary | ICD-10-CM | POA: Diagnosis not present

## 2021-09-21 DIAGNOSIS — R197 Diarrhea, unspecified: Secondary | ICD-10-CM | POA: Insufficient documentation

## 2021-09-21 LAB — CBC
HCT: 39.4 % (ref 36.0–46.0)
Hemoglobin: 12.4 g/dL (ref 12.0–15.0)
MCH: 27.1 pg (ref 26.0–34.0)
MCHC: 31.5 g/dL (ref 30.0–36.0)
MCV: 86 fL (ref 80.0–100.0)
Platelets: 172 10*3/uL (ref 150–400)
RBC: 4.58 MIL/uL (ref 3.87–5.11)
RDW: 13.7 % (ref 11.5–15.5)
WBC: 12 10*3/uL — ABNORMAL HIGH (ref 4.0–10.5)
nRBC: 0 % (ref 0.0–0.2)

## 2021-09-21 LAB — COMPREHENSIVE METABOLIC PANEL
ALT: 19 U/L (ref 0–44)
AST: 17 U/L (ref 15–41)
Albumin: 4.2 g/dL (ref 3.5–5.0)
Alkaline Phosphatase: 99 U/L (ref 38–126)
Anion gap: 7 (ref 5–15)
BUN: 16 mg/dL (ref 6–20)
CO2: 27 mmol/L (ref 22–32)
Calcium: 8.8 mg/dL — ABNORMAL LOW (ref 8.9–10.3)
Chloride: 106 mmol/L (ref 98–111)
Creatinine, Ser: 0.6 mg/dL (ref 0.44–1.00)
GFR, Estimated: >60 mL/min (ref >60)
Glucose, Bld: 100 mg/dL — ABNORMAL HIGH (ref 70–99)
Potassium: 3.7 mmol/L (ref 3.5–5.1)
Sodium: 140 mmol/L (ref 135–145)
Total Bilirubin: 0.5 mg/dL (ref 0.3–1.2)
Total Protein: 8 g/dL (ref 6.5–8.1)

## 2021-09-21 LAB — URINALYSIS, ROUTINE W REFLEX MICROSCOPIC
Bilirubin Urine: NEGATIVE
Glucose, UA: NEGATIVE mg/dL
Hgb urine dipstick: NEGATIVE
Ketones, ur: NEGATIVE mg/dL
Leukocytes,Ua: NEGATIVE
Nitrite: NEGATIVE
Protein, ur: NEGATIVE mg/dL
Specific Gravity, Urine: 1.025 (ref 1.005–1.030)
pH: 7 (ref 5.0–8.0)

## 2021-09-21 LAB — POC URINE PREG, ED: Preg Test, Ur: NEGATIVE

## 2021-09-21 NOTE — ED Triage Notes (Signed)
Pt states is having lower abd pain, green vaginal discharge, diarrhea and cough. Pt states discharge and cough began this am, other symptoms began 4 days ago.  ?

## 2021-09-21 NOTE — ED Triage Notes (Signed)
Pt arrived via POV with reports of abd pain and green discharge, pt c/o cough ?

## 2021-09-22 ENCOUNTER — Emergency Department
Admission: EM | Admit: 2021-09-22 | Discharge: 2021-09-22 | Discharge status: Against Medical Advice | Payer: Medicaid Other | Attending: Emergency Medicine | Admitting: Emergency Medicine

## 2021-09-27 DIAGNOSIS — Z419 Encounter for procedure for purposes other than remedying health state, unspecified: Secondary | ICD-10-CM | POA: Diagnosis not present

## 2021-10-01 ENCOUNTER — Other Ambulatory Visit: Payer: Self-pay

## 2021-10-01 ENCOUNTER — Encounter: Payer: Self-pay | Admitting: Emergency Medicine

## 2021-10-01 ENCOUNTER — Emergency Department
Admission: EM | Admit: 2021-10-01 | Discharge: 2021-10-01 | Disposition: A | Discharge status: Home / Self Care | Payer: Medicaid Other | Attending: Emergency Medicine | Admitting: Emergency Medicine

## 2021-10-01 DIAGNOSIS — D72829 Elevated white blood cell count, unspecified: Secondary | ICD-10-CM | POA: Diagnosis not present

## 2021-10-01 DIAGNOSIS — R1013 Epigastric pain: Secondary | ICD-10-CM | POA: Diagnosis not present

## 2021-10-01 DIAGNOSIS — K529 Noninfective gastroenteritis and colitis, unspecified: Secondary | ICD-10-CM | POA: Diagnosis not present

## 2021-10-01 DIAGNOSIS — R9431 Abnormal electrocardiogram [ECG] [EKG]: Secondary | ICD-10-CM | POA: Diagnosis not present

## 2021-10-01 DIAGNOSIS — R1110 Vomiting, unspecified: Secondary | ICD-10-CM | POA: Diagnosis present

## 2021-10-01 LAB — URINALYSIS, ROUTINE W REFLEX MICROSCOPIC
Bacteria, UA: NONE SEEN
Bilirubin Urine: NEGATIVE
Glucose, UA: NEGATIVE mg/dL
Ketones, ur: 5 mg/dL — AB
Leukocytes,Ua: NEGATIVE
Nitrite: NEGATIVE
Protein, ur: 30 mg/dL — AB
Specific Gravity, Urine: 1.031 — ABNORMAL HIGH (ref 1.005–1.030)
Squamous Epithelial / HPF: NONE SEEN (ref 0–5)
pH: 6 (ref 5.0–8.0)

## 2021-10-01 LAB — CBC
HCT: 43 % (ref 36.0–46.0)
Hemoglobin: 13.8 g/dL (ref 12.0–15.0)
MCH: 27.1 pg (ref 26.0–34.0)
MCHC: 32.1 g/dL (ref 30.0–36.0)
MCV: 84.5 fL (ref 80.0–100.0)
Platelets: 215 10*3/uL (ref 150–400)
RBC: 5.09 MIL/uL (ref 3.87–5.11)
RDW: 13.4 % (ref 11.5–15.5)
WBC: 14.2 10*3/uL — ABNORMAL HIGH (ref 4.0–10.5)
nRBC: 0 % (ref 0.0–0.2)

## 2021-10-01 LAB — COMPREHENSIVE METABOLIC PANEL
ALT: 26 U/L (ref 0–44)
AST: 25 U/L (ref 15–41)
Albumin: 4.4 g/dL (ref 3.5–5.0)
Alkaline Phosphatase: 107 U/L (ref 38–126)
Anion gap: 10 (ref 5–15)
BUN: 14 mg/dL (ref 6–20)
CO2: 25 mmol/L (ref 22–32)
Calcium: 9.3 mg/dL (ref 8.9–10.3)
Chloride: 103 mmol/L (ref 98–111)
Creatinine, Ser: 0.61 mg/dL (ref 0.44–1.00)
GFR, Estimated: >60 mL/min (ref >60)
Glucose, Bld: 104 mg/dL — ABNORMAL HIGH (ref 70–99)
Potassium: 3.7 mmol/L (ref 3.5–5.1)
Sodium: 138 mmol/L (ref 135–145)
Total Bilirubin: 0.6 mg/dL (ref 0.3–1.2)
Total Protein: 8.6 g/dL — ABNORMAL HIGH (ref 6.5–8.1)

## 2021-10-01 LAB — CHLAMYDIA/NGC RT PCR (ARMC ONLY)
Chlamydia Tr: NOT DETECTED
N gonorrhoeae: NOT DETECTED

## 2021-10-01 LAB — PREGNANCY, URINE: Preg Test, Ur: NEGATIVE

## 2021-10-01 LAB — LIPASE, BLOOD: Lipase: 28 U/L (ref 11–51)

## 2021-10-01 MED ORDER — ONDANSETRON 4 MG PO TBDP: 4.0000 mg | ORAL_TABLET | Freq: Three times a day (TID) | ORAL | 0 refills | Status: DC | PRN

## 2021-10-01 MED ORDER — LOPERAMIDE HCL 2 MG PO TABS: 2.0000 mg | ORAL_TABLET | Freq: Four times a day (QID) | ORAL | 0 refills | Status: DC | PRN

## 2021-10-01 MED ORDER — LACTATED RINGERS IV BOLUS
1000.0000 mL | Freq: Once | INTRAVENOUS | Status: AC
  Administered 2021-10-01: 1000 mL via INTRAVENOUS

## 2021-10-01 MED ORDER — LOPERAMIDE HCL 2 MG PO CAPS
2.0000 mg | ORAL_CAPSULE | Freq: Once | ORAL | Status: AC
  Administered 2021-10-01: 2 mg via ORAL
  Filled 2021-10-01: qty 1

## 2021-10-01 MED ORDER — ONDANSETRON HCL 4 MG/2ML IJ SOLN
4.0000 mg | Freq: Once | INTRAMUSCULAR | Status: AC
  Administered 2021-10-01: 4 mg via INTRAVENOUS
  Filled 2021-10-01: qty 2

## 2021-10-01 MED ORDER — KETOROLAC TROMETHAMINE 30 MG/ML IJ SOLN
15.0000 mg | Freq: Once | INTRAMUSCULAR | Status: AC
  Administered 2021-10-01: 15 mg via INTRAVENOUS
  Filled 2021-10-01: qty 1

## 2021-10-01 NOTE — ED Notes (Signed)
Pt presents to ED with c/o of N/V/D. Pt states she came to ED a few ays ago for green vaginal discharge and same symptoms as today. Pt denies any recent ABX use. Pt is A&Ox4 at this time. Pt denies fevers or chills to this RN. NAD noted.  ?

## 2021-10-01 NOTE — ED Provider Notes (Signed)
? ?[facility] ?Provider Note ? ? ? None  ?  (approximate) ? ? ?History  ? ?Chief Complaint ?Emesis, Abdominal Pain (/), and Diarrhea ? ? ?HPI ? ?Nichole Carlson is a 26 y.o. female with no significant past medical history who presents to the ED complaining of abdominal pain, vomiting, and diarrhea.  Patient reports that around 1 PM this afternoon she had acute onset of periumbilical abdominal pain along with nausea, vomiting, and diarrhea.  She describes the abdominal pain as sharp and constant, not exacerbated or alleviated by anything in particular.  She has not had any fevers and denies any dysuria, hematuria, or flank pain.  She has not noticed any blood in her emesis or stool, denies any recent travel.  She is not aware of any sick contacts, states symptoms came on while she was working at [employer].  She does state that she was dealing with vaginal discharge last week, had checked into the ED at that time but left without being seen.  She states the discharge has resolved and she has since started her period. ?  ? ? ?Physical Exam  ? ?Triage Vital Signs: ?ED Triage Vitals [10/01/21 1654]  ?Enc Vitals Group  ?   BP   ?   Pulse   ?   Resp   ?   Temp   ?   Temp src   ?   SpO2   ?   Weight (!) 322 lb (146.1 kg)  ?   Height 5\' 9"  (1.753 m)  ?   Head Circumference   ?   Peak Flow   ?   Pain Score 8  ?   Pain Loc   ?   Pain Edu?   ?   Excl. in GC?   ? ? ?Most recent vital signs: ?Vitals:  ? 10/01/21 1700 10/01/21 1930  ?BP: 100/70 (!) 109/57  ?Pulse: 76 78  ?Resp: 18 20  ?Temp:  98.8 ?F (37.1 ?C)  ?SpO2: 100% 98%  ? ? ?Constitutional: Alert and oriented. ?Eyes: Conjunctivae are normal. ?Head: Atraumatic. ?Nose: No congestion/rhinnorhea. ?Mouth/Throat: Mucous membranes are moist.  ?Cardiovascular: Normal rate, regular rhythm. Grossly normal heart sounds.  2+ radial pulses bilaterally. ?Respiratory: Normal respiratory effort.  No retractions. Lungs CTAB. ?Gastrointestinal: Soft and nontender. No  distention. ?Musculoskeletal: No lower extremity tenderness nor edema.  ?Neurologic:  Normal speech and language. No gross focal neurologic deficits are appreciated. ? ? ? ?ED Results / Procedures / Treatments  ? ?Labs ?(all labs ordered are listed, but only abnormal results are displayed) ?Labs Reviewed  ?COMPREHENSIVE METABOLIC PANEL - Abnormal; Notable for the following components:  ?    Result Value  ? Glucose, Bld 104 (*)   ? Total Protein 8.6 (*)   ? All other components within normal limits  ?CBC - Abnormal; Notable for the following components:  ? WBC 14.2 (*)   ? All other components within normal limits  ?URINALYSIS, ROUTINE W REFLEX MICROSCOPIC - Abnormal; Notable for the following components:  ? Color, Urine YELLOW (*)   ? APPearance HAZY (*)   ? Specific Gravity, Urine 1.031 (*)   ? Hgb urine dipstick SMALL (*)   ? Ketones, ur 5 (*)   ? Protein, ur 30 (*)   ? All other components within normal limits  ?CHLAMYDIA/NGC RT PCR ([facility] ONLY)            ?LIPASE, BLOOD  ?PREGNANCY, URINE  ? ? ? ?EKG ? ?ED ECG  REPORT ?Harriet Masson, the attending physician, personally viewed and interpreted this ECG. ? ? Date: 10/01/2021 ? EKG Time: 17:46 ? Rate: 73 ? Rhythm: normal sinus rhythm ? Axis: Normal ? Intervals:none ? ST&T Change: None ? ?PROCEDURES: ? ?Critical Care performed: No ? ?Procedures ? ? ?MEDICATIONS ORDERED IN ED: ?Medications  ?lactated ringers bolus 1,000 mL (1,000 mLs Intravenous Bolus 10/01/21 1718)  ?ondansetron [facility]) injection 4 mg (4 mg Intravenous Given 10/01/21 1718)  ?ketorolac (TORADOL) 30 MG/ML injection 15 mg (15 mg Intravenous Given 10/01/21 1927)  ?loperamide (IMODIUM) capsule 2 mg (2 mg Oral Given 10/01/21 1927)  ? ? ? ?IMPRESSION / MDM / ASSESSMENT AND PLAN / ED COURSE  ?I reviewed the triage vital signs and the nursing notes. ?             ?               ? ?26 y.o. female with no significant past medical history presents to the ED complaining of acute onset abdominal pain, nausea,  vomiting, and diarrhea around 1 PM this afternoon. ? ?Differential diagnosis includes, but is not limited to, gastroenteritis, gastritis, pancreatitis, biliary colic, hepatitis, cholecystitis, ectopic pregnancy, intrauterine pregnancy, dehydration, electrolyte abnormality. ? ?Patient is nontoxic-appearing and in no acute distress, vitals remarkable for borderline low blood pressure but suspect this is related to dehydration versus vagal episode.  We will screen EKG and labs, hydrate with IV fluids and treat symptomatically with IV Zofran.  Pregnancy testing and UA are pending.  Abdominal exam is benign and symptoms sound most consistent with a gastroenteritis, we will hold off on CT imaging at this time. ? ?Pregnancy testing is negative and UA does not appear consistent with infection, does have some RBCs attributable to patient's menses.  Additional labs are reassuring with BMP showing no AKI or electrolyte abnormality, LFTs and lipase within normal limits.  Patient with mild leukocytosis on CBC but this is likely related to viral infection.  She is feeling better following symptomatic treatment and is appropriate for discharge home with prescription for Zofran for her gastroenteritis.  She was counseled to return to the ED for new worsening symptoms, patient agrees with plan. ? ?  ? ? ?FINAL CLINICAL IMPRESSION(S) / ED DIAGNOSES  ? ?Final diagnoses:  ?Gastroenteritis  ? ? ? ?Rx / DC Orders  ? ?ED Discharge Orders   ? ?      Ordered  ?  ondansetron (ZOFRAN-ODT) 4 MG disintegrating tablet  Every 8 hours PRN       ? 10/01/21 2026  ?  loperamide (IMODIUM A-D) 2 MG tablet  4 times daily PRN       ? 10/01/21 2026  ? ?  ?  ? ?  ? ? ? ?Note:  This document was prepared using Dragon voice recognition software and may include unintentional dictation errors. ?  ?Chesley Noon, MD ?10/01/21 2028 ? ?

## 2021-10-01 NOTE — ED Triage Notes (Signed)
Pt comes into the ED via PO c/o N/V/D and abd pain that started today.  Pt explains the abd pain is generalized.  Pt in NAD at this time with even and unlabored respirations.  ?

## 2021-10-02 ENCOUNTER — Telehealth: Payer: Self-pay

## 2021-10-02 DIAGNOSIS — Z9189 Other specified personal risk factors, not elsewhere classified: Secondary | ICD-10-CM

## 2021-10-02 NOTE — Telephone Encounter (Signed)
? ?  Telephone encounter was:  Unsuccessful.  10/02/2021 ?Name: Nichole Carlson MRN: [id number] DOB: [birth year] ? ?Unsuccessful outbound call made today to assist with:   PCP Search ? ?Outreach Attempt:  1st Attempt ? ?A HIPAA compliant voice message was left requesting a return call.  Instructed patient to call back at [phone] at their earliest convenience. ? ?Hessie Knows ?Care Guide, Embedded Care Coordination ?[facility] Health  Care management  ?[place], [place] [address]  ?Main Phone: [phone]  E-mail: [email or web address]  ?Website: [email or web address] ? ? ? ?

## 2021-10-02 NOTE — Telephone Encounter (Signed)
Transition Care Management Follow-up Telephone Call ?Date of discharge and from where: 08/31/2021-[facility] ?How have you been since you were released from the hospital? Pt stated she is doing a little bit better than before.  ?Any questions or concerns? No ? ?Items Reviewed: ?Did the pt receive and understand the discharge instructions provided? Yes  ?Medications obtained and verified? Yes  ?Other? No  ?Any new allergies since your discharge? No  ?Dietary orders reviewed? No ?Do you have support at home? Yes  ? ?Home Care and Equipment/Supplies: ?Were home health services ordered? not applicable ?If so, what is the name of the agency? N/A  ?Has the agency set up a time to come to the patient's home? not applicable ?Were any new equipment or medical supplies ordered?  No ?What is the name of the medical supply agency? N/A ?Were you able to get the supplies/equipment? not applicable ?Do you have any questions related to the use of the equipment or supplies? No ? ?Functional Questionnaire: (I = Independent and D = Dependent) ?ADLs: I ? ?Bathing/Dressing- I ? ?Meal Prep- I ? ?Eating- I ? ?Maintaining continence- I ? ?Transferring/Ambulation- I ? ?Managing Meds- I ? ?Follow up appointments reviewed: ? ?PCP Hospital f/u appt confirmed? No   ?Specialist Hospital f/u appt confirmed? No   ?Are transportation arrangements needed? No  ?If their condition worsens, is the pt aware to call PCP or go to the Emergency Dept.? Yes ?Was the patient provided with contact information for the PCP's office or ED? Yes ?Was to pt encouraged to call back with questions or concerns? Yes  ?

## 2021-10-03 ENCOUNTER — Telehealth: Payer: Self-pay

## 2021-10-03 NOTE — Telephone Encounter (Signed)
? ?  Telephone encounter was:  Unsuccessful.  10/03/2021 ?Name: Nichole Carlson MRN: [id number] DOB: 1995-09-26 ? ?Unsuccessful outbound call made today to assist with:   PCP Search ? ?Outreach Attempt:  2nd Attempt ? ?A HIPAA compliant voice message was left requesting a return call.  Instructed patient to call back at [phone] at their earliest convenience. ? ?Hessie Knows ?Care Guide, Embedded Care Coordination ?[facility]  Care management  ?[place], [place] [address]  ?Main Phone: [phone]  E-mail: [email or web address]  ?Website: [email or web address] ? ? ?

## 2021-10-06 ENCOUNTER — Telehealth: Payer: Self-pay

## 2021-10-06 NOTE — Telephone Encounter (Signed)
? ?  Telephone encounter was:  Unsuccessful.  10/06/2021 ?Name: Nichole Carlson MRN: [id number] DOB: [birth year] ? ?Unsuccessful outbound call made today to assist with:   PCP Search ? ?Outreach Attempt:  3rd Attempt.  Referral closed unable to contact patient. ? ?A HIPAA compliant voice message was left requesting a return call.  Instructed patient to call back at [phone] at their earliest convenience. ? ?Contacted pt 3 times and left voicemails. Also reached out to pt's father and was unable to reach him. I left a voicemail as well on his number. ? ?Referring Provider/Clinic will be notified. ? ?Cameron Proud ?Care Guide, Embedded Care Coordination ?[facility] Health  Care management  ?[place], [place]  ?Main Phone: [phone]  E-mail: [email or web address]  ?Website: [email or web address] ? ? ?

## 2021-10-27 DIAGNOSIS — Z419 Encounter for procedure for purposes other than remedying health state, unspecified: Secondary | ICD-10-CM | POA: Diagnosis not present

## 2021-11-21 ENCOUNTER — Encounter: Payer: Self-pay | Admitting: Emergency Medicine

## 2021-11-21 ENCOUNTER — Other Ambulatory Visit: Payer: Self-pay

## 2021-11-21 DIAGNOSIS — M545 Low back pain, unspecified: Secondary | ICD-10-CM | POA: Diagnosis not present

## 2021-11-21 DIAGNOSIS — R1030 Lower abdominal pain, unspecified: Secondary | ICD-10-CM | POA: Diagnosis not present

## 2021-11-21 DIAGNOSIS — N760 Acute vaginitis: Secondary | ICD-10-CM | POA: Diagnosis not present

## 2021-11-21 DIAGNOSIS — Z5321 Procedure and treatment not carried out due to patient leaving prior to being seen by health care provider: Secondary | ICD-10-CM | POA: Insufficient documentation

## 2021-11-21 DIAGNOSIS — A599 Trichomoniasis, unspecified: Secondary | ICD-10-CM | POA: Diagnosis not present

## 2021-11-21 DIAGNOSIS — R1084 Generalized abdominal pain: Secondary | ICD-10-CM | POA: Diagnosis not present

## 2021-11-21 DIAGNOSIS — B9689 Other specified bacterial agents as the cause of diseases classified elsewhere: Secondary | ICD-10-CM | POA: Diagnosis not present

## 2021-11-21 LAB — CBC
HCT: 38.9 % (ref 36.0–46.0)
Hemoglobin: 12.4 g/dL (ref 12.0–15.0)
MCH: 27.2 pg (ref 26.0–34.0)
MCHC: 31.9 g/dL (ref 30.0–36.0)
MCV: 85.3 fL (ref 80.0–100.0)
Platelets: 200 10*3/uL (ref 150–400)
RBC: 4.56 MIL/uL (ref 3.87–5.11)
RDW: 13.5 % (ref 11.5–15.5)
WBC: 10.8 10*3/uL — ABNORMAL HIGH (ref 4.0–10.5)
nRBC: 0 % (ref 0.0–0.2)

## 2021-11-21 LAB — COMPREHENSIVE METABOLIC PANEL
ALT: 19 U/L (ref 0–44)
AST: 20 U/L (ref 15–41)
Albumin: 3.9 g/dL (ref 3.5–5.0)
Alkaline Phosphatase: 86 U/L (ref 38–126)
Anion gap: 8 (ref 5–15)
BUN: 14 mg/dL (ref 6–20)
CO2: 25 mmol/L (ref 22–32)
Calcium: 9.6 mg/dL (ref 8.9–10.3)
Chloride: 108 mmol/L (ref 98–111)
Creatinine, Ser: 0.59 mg/dL (ref 0.44–1.00)
GFR, Estimated: >60 mL/min (ref >60)
Glucose, Bld: 98 mg/dL (ref 70–99)
Potassium: 3.3 mmol/L — ABNORMAL LOW (ref 3.5–5.1)
Sodium: 141 mmol/L (ref 135–145)
Total Bilirubin: 0.5 mg/dL (ref 0.3–1.2)
Total Protein: 7.3 g/dL (ref 6.5–8.1)

## 2021-11-21 LAB — LIPASE, BLOOD: Lipase: 28 U/L (ref 11–51)

## 2021-11-21 MED ORDER — ACETAMINOPHEN 325 MG PO TABS
650.0000 mg | ORAL_TABLET | Freq: Once | ORAL | Status: DC
Start: 2021-11-21 — End: 2021-11-22

## 2021-11-21 NOTE — ED Notes (Signed)
Pt brought back to triage 1.  While obtaining pts temp, pt kept opening her mouth.  When asked her to please keep her mouth closed for a few seconds, she stated "My nose is stuffed up and when I need to breathe Im going to open my mouth".  I asked the pt to "please bear with me as I just need to get a quick temp".  Pt stated "you have a real attittude and are a bitch.  I don't feel good and you're about to get cussed the fuck out.  I dont want you in here and I want someone else to take care of me".  I stated "Ill get someone else".  Nettie Elm RN went in with the pt to complete triage, labs, vitals.

## 2021-11-21 NOTE — ED Triage Notes (Signed)
Pt presents to ER from home with complaints of lower back pain and generalized abdominal pain. Pt reports these symptoms started yesterday when she started her period. Pt tearful in triage. Pt reports she is is pain she has taken ibuprofen and tylenol.

## 2021-11-22 ENCOUNTER — Emergency Department
Admission: EM | Admit: 2021-11-22 | Discharge: 2021-11-22 | Discharge status: Against Medical Advice | Payer: Medicaid Other | Attending: Emergency Medicine | Admitting: Emergency Medicine

## 2021-11-25 ENCOUNTER — Telehealth: Payer: Self-pay

## 2021-11-25 NOTE — Telephone Encounter (Signed)
Transition Care Management Unsuccessful Follow-up Telephone Call  Date of discharge and from where:  11/22/2021 from [facility]  Attempts:  1st Attempt  Reason for unsuccessful TCM follow-up call:  Left voice message

## 2021-11-26 NOTE — Telephone Encounter (Signed)
Transition Care Management Unsuccessful Follow-up Telephone Call  Date of discharge and from where:  11/22/2021 from [facility]  Attempts:  2nd Attempt  Reason for unsuccessful TCM follow-up call:  Unable to leave message  Patient picked up then hung up after introducing myself.

## 2021-11-27 DIAGNOSIS — Z419 Encounter for procedure for purposes other than remedying health state, unspecified: Secondary | ICD-10-CM | POA: Diagnosis not present

## 2021-11-27 NOTE — Telephone Encounter (Signed)
Transition Care Management Unsuccessful Follow-up Telephone Call  Date of discharge and from where:  11/22/2021-[facility]  Attempts:  3rd Attempt  Reason for unsuccessful TCM follow-up call:  Left voice message

## 2021-11-28 DIAGNOSIS — M791 Myalgia, unspecified site: Secondary | ICD-10-CM | POA: Diagnosis not present

## 2021-11-28 DIAGNOSIS — R519 Headache, unspecified: Secondary | ICD-10-CM | POA: Diagnosis not present

## 2021-11-28 DIAGNOSIS — Z20822 Contact with and (suspected) exposure to covid-19: Secondary | ICD-10-CM | POA: Diagnosis not present

## 2021-11-28 DIAGNOSIS — R059 Cough, unspecified: Secondary | ICD-10-CM | POA: Diagnosis not present

## 2021-11-28 DIAGNOSIS — R5383 Other fatigue: Secondary | ICD-10-CM | POA: Diagnosis not present

## 2021-11-28 DIAGNOSIS — R0981 Nasal congestion: Secondary | ICD-10-CM | POA: Diagnosis not present

## 2021-11-28 DIAGNOSIS — J028 Acute pharyngitis due to other specified organisms: Secondary | ICD-10-CM | POA: Diagnosis not present

## 2021-11-28 DIAGNOSIS — B9789 Other viral agents as the cause of diseases classified elsewhere: Secondary | ICD-10-CM | POA: Diagnosis not present

## 2021-11-28 DIAGNOSIS — J029 Acute pharyngitis, unspecified: Secondary | ICD-10-CM | POA: Diagnosis not present

## 2021-12-01 ENCOUNTER — Telehealth: Payer: Self-pay

## 2021-12-01 NOTE — Telephone Encounter (Signed)
Transition Care Management Unsuccessful Follow-up Telephone Call  Date of discharge and from where:  11/28/2021 from [facility]  Attempts:  1st Attempt  Reason for unsuccessful TCM follow-up call:  Left voice message

## 2021-12-02 NOTE — Telephone Encounter (Signed)
Transition Care Management Follow-up Telephone Call Date of discharge and from where: 11/28/2021 from [facility] How have you been since you were released from the hospital? Patient stated that she is feeling better and did not have any questions or concerns at this time.  Any questions or concerns? No  Items Reviewed: Did the pt receive and understand the discharge instructions provided? Yes  Medications obtained and verified? Yes  Other? No  Any new allergies since your discharge? No  Dietary orders reviewed? No Do you have support at home? Yes   Functional Questionnaire: (I = Independent and D = Dependent) ADLs: I  Bathing/Dressing- I  Meal Prep- I  Eating- I  Maintaining continence- I  Transferring/Ambulation- I  Managing Meds- I   Follow up appointments reviewed:  PCP Hospital f/u appt confirmed? No  Patient did not want to est care at this time.  [facility] f/u appt confirmed? No   Are transportation arrangements needed? No  If their condition worsens, is the pt aware to call PCP or go to the Emergency Dept.? Yes Was the patient provided with contact information for the PCP's office or ED? Yes Was to pt encouraged to call back with questions or concerns? Yes

## 2021-12-12 ENCOUNTER — Ambulatory Visit (INDEPENDENT_AMBULATORY_CARE_PROVIDER_SITE_OTHER): Payer: Medicaid Other | Admitting: Nurse Practitioner

## 2021-12-12 ENCOUNTER — Encounter: Payer: Self-pay | Admitting: Nurse Practitioner

## 2021-12-12 VITALS — BP 138/84 | HR 88 | Ht 69.0 in | Wt 319.8 lb

## 2021-12-12 DIAGNOSIS — Z6841 Body Mass Index (BMI) 40.0 and over, adult: Secondary | ICD-10-CM | POA: Diagnosis not present

## 2021-12-12 DIAGNOSIS — Z01419 Encounter for gynecological examination (general) (routine) without abnormal findings: Secondary | ICD-10-CM

## 2021-12-12 DIAGNOSIS — Z7689 Persons encountering health services in other specified circumstances: Secondary | ICD-10-CM | POA: Diagnosis not present

## 2021-12-12 NOTE — Assessment & Plan Note (Signed)
Care established. Screening labs ordered. Would refer her to GYN for routine GYN exam

## 2021-12-12 NOTE — Progress Notes (Signed)
New Patient Office Visit  Subjective    Patient ID: Nichole Carlson, female    DOB: 11-24-1995  Age: 26 y.o. MRN: [id number]  CC:  Chief Complaint  Patient presents with   New Patient (Initial Visit)    HPI Nichole Carlson presents to establish care. She states that she was diagnosed with trichomonas 3 weeks ago and has finished the metronidazole 500 mg twice a day for 7 days.  She still complains of on and off discharge.  Patient would like to see a GYN for routine gynecological exam.   Outpatient Encounter Medications as of 12/12/2021  Medication Sig   [DISCONTINUED] loperamide (IMODIUM A-D) 2 MG tablet Take 1 tablet (2 mg total) by mouth 4 (four) times daily as needed for diarrhea or loose stools.   [DISCONTINUED] meloxicam (MOBIC) 15 MG tablet Take 1 tablet (15 mg total) by mouth daily.   [DISCONTINUED] methocarbamol (ROBAXIN) 500 MG tablet Take 1 tablet (500 mg total) by mouth 4 (four) times daily.   [DISCONTINUED] ondansetron (ZOFRAN-ODT) 4 MG disintegrating tablet Take 1 tablet (4 mg total) by mouth every 8 (eight) hours as needed for nausea or vomiting.   [DISCONTINUED] Prenatal Vit-Fe Fumarate-FA (PRENATAL VITAMINS PO) Take by mouth.   [DISCONTINUED] acetaminophen (TYLENOL) 500 MG tablet Take 2 tablets (1,000 mg total) by mouth every 8 (eight) hours as needed for mild pain, moderate pain, fever or headache. (Patient not taking: Reported on 11/15/2020)   [DISCONTINUED] coconut oil OIL Apply 1 application topically as needed (nipple pain). (Patient not taking: Reported on 11/15/2020)   [DISCONTINUED] dicyclomine (BENTYL) 10 MG capsule Take 1 capsule (10 mg total) by mouth 4 (four) times daily for 14 days.   [DISCONTINUED] ibuprofen (ADVIL) 800 MG tablet Take 1 tablet (800 mg total) by mouth every 8 (eight) hours as needed for moderate pain or cramping. (Patient not taking: Reported on 11/15/2020)   [DISCONTINUED] ondansetron (ZOFRAN) 4 MG tablet Take 1 tablet (4 mg total) by mouth every 8  (eight) hours as needed. (Patient not taking: Reported on 11/15/2020)   No facility-administered encounter medications on file as of 12/12/2021.    Past Medical History:  Diagnosis Date   COVID-19    Family history of adverse reaction to anesthesia    mom had problems waking up   Gestational diabetes 04/19/2020   UTI (lower urinary tract infection)    Vertigo    Yeast infection     Past Surgical History:  Procedure Laterality Date   TONSILLECTOMY     TONSILLECTOMY     wisdonm teeth removed      Family History  Problem Relation Age of Onset   Congestive Heart Failure Mother    COPD Mother    Diabetes Mother    Hypertension Mother    Gout Mother    Hypertension Father    Diabetes Father    Cancer Maternal Grandmother        skin   Breast cancer Maternal Grandmother 33    Social History   Socioeconomic History   Marital status: Single    Spouse name: Not on file   Number of children: Not on file   Years of education: Not on file   Highest education level: Not on file  Occupational History    Employer: [employer]  Tobacco Use   Smoking status: Former    Packs/day: 0.50    Types: Cigarettes   Smokeless tobacco: Never  Vaping Use   Vaping Use: Never used  Substance and  Sexual Activity   Alcohol use: Not Currently    Comment: occasional   Drug use: Yes    Types: Marijuana   Sexual activity: Yes    Partners: Male    Birth control/protection: Condom  Other Topics Concern   Not on file  Social History Narrative   Not on file   Social Determinants of Health   Financial Resource Strain: Not on file  Food Insecurity: Not on file  Transportation Needs: Not on file  Physical Activity: Not on file  Stress: Not on file  Social Connections: Not on file  Intimate Partner Violence: Not on file    Review of Systems  Constitutional:  Negative for fever and weight loss.  HENT:  Negative for ear discharge and hearing loss.   Eyes:  Negative for blurred  vision and pain.  Respiratory:  Negative for cough, shortness of breath and stridor.   Cardiovascular:  Negative for chest pain and claudication.  Gastrointestinal:  Negative for abdominal pain and heartburn.  Genitourinary:  Negative for dysuria and hematuria.  Musculoskeletal:  Negative for joint pain and myalgias.  Neurological:  Negative for dizziness, tingling and headaches.  Psychiatric/Behavioral:  Positive for substance abuse (Marijuana). Negative for depression and suicidal ideas.         Objective    BP 138/84   Pulse 88   Ht 5\' 9"  (1.753 m)   Wt (!) 319 lb 12.8 oz (145.1 kg)   LMP 11/20/2021 (Exact Date)   BMI 47.23 kg/m   Physical Exam Constitutional:      Appearance: Normal appearance. She is obese.  HENT:     Head: Normocephalic and atraumatic.     Right Ear: Tympanic membrane normal.     Left Ear: Tympanic membrane normal.     Nose: Nose normal.     Mouth/Throat:     Mouth: Mucous membranes are moist.     Pharynx: Oropharynx is clear.  Eyes:     Extraocular Movements: Extraocular movements intact.     Conjunctiva/sclera: Conjunctivae normal.     Pupils: Pupils are equal, round, and reactive to light.  Cardiovascular:     Rate and Rhythm: Normal rate and regular rhythm.     Pulses: Normal pulses.  Pulmonary:     Effort: Pulmonary effort is normal.  Abdominal:     General: Bowel sounds are normal.     Palpations: Abdomen is soft.  Skin:    General: Skin is warm.     Capillary Refill: Capillary refill takes less than 2 seconds.  Neurological:     General: No focal deficit present.     Mental Status: She is alert and oriented to person, place, and time. Mental status is at baseline.  Psychiatric:        Mood and Affect: Mood normal.        Behavior: Behavior normal.        Thought Content: Thought content normal.        Judgment: Judgment normal.         Assessment & Plan:   Problem List Items Addressed This Visit       Other   Encounter  to establish care with new doctor - Primary    Care established. Screening labs ordered. Would refer her to GYN for routine GYN exam      Relevant Orders   CBC with Differential/Platelet   COMPLETE METABOLIC PANEL WITH GFR   Lipid panel   Hemoglobin A1c   TSH  Class 3 severe obesity without serious comorbidity with body mass index (BMI) of 45.0 to 49.9 in adult [facility])    Body mass index is 47.23 kg/m. Advised pt to lose weight. Advised patient to avoid trans fat, fatty and fried food. Follow a regular physical activity schedule.            Other Visit Diagnoses     Encounter for gynecological examination without abnormal finding       Relevant Orders   Ambulatory referral to Gynecology       No follow-ups on file.   Kara Dies, NP

## 2021-12-12 NOTE — Assessment & Plan Note (Signed)
Body mass index is 47.23 kg/m. Advised pt to lose weight. Advised patient to avoid trans fat, fatty and fried food. Follow a regular physical activity schedule.

## 2021-12-27 DIAGNOSIS — Z419 Encounter for procedure for purposes other than remedying health state, unspecified: Secondary | ICD-10-CM | POA: Diagnosis not present

## 2022-01-27 DIAGNOSIS — Z419 Encounter for procedure for purposes other than remedying health state, unspecified: Secondary | ICD-10-CM | POA: Diagnosis not present

## 2022-02-13 ENCOUNTER — Ambulatory Visit: Payer: Medicaid Other

## 2022-02-13 DIAGNOSIS — U071 COVID-19: Secondary | ICD-10-CM

## 2022-02-13 LAB — POC COVID19 BINAXNOW: NEG CONTROL: POSITIVE

## 2022-02-13 NOTE — Progress Notes (Unsigned)
Patient came for nurse to be checked for covid

## 2022-02-27 DIAGNOSIS — Z419 Encounter for procedure for purposes other than remedying health state, unspecified: Secondary | ICD-10-CM | POA: Diagnosis not present

## 2022-03-29 DIAGNOSIS — Z419 Encounter for procedure for purposes other than remedying health state, unspecified: Secondary | ICD-10-CM | POA: Diagnosis not present

## 2022-04-24 LAB — GC/CHLAMYDIA PROBE AMP
Chlamydia trachomatis, NAA: NEGATIVE
Neisseria Gonorrhoeae by PCR: NEGATIVE

## 2022-04-29 DIAGNOSIS — Z419 Encounter for procedure for purposes other than remedying health state, unspecified: Secondary | ICD-10-CM | POA: Diagnosis not present

## 2022-05-29 DIAGNOSIS — Z419 Encounter for procedure for purposes other than remedying health state, unspecified: Secondary | ICD-10-CM | POA: Diagnosis not present

## 2022-06-29 DIAGNOSIS — Z419 Encounter for procedure for purposes other than remedying health state, unspecified: Secondary | ICD-10-CM | POA: Diagnosis not present

## 2022-07-28 ENCOUNTER — Telehealth: Payer: Self-pay | Admitting: *Deleted

## 2022-07-28 NOTE — Patient Outreach (Signed)
  Care Coordination [facility] Note Transition Care Management Unsuccessful Follow-up Telephone Call  Date of discharge and from where:  07/25/22 from [facility] ED  Attempts:  1st Attempt  Reason for unsuccessful TCM follow-up call:  Left voice message  Lurena Joiner RN, BSN [place] RN Care Coordinator

## 2022-07-30 DIAGNOSIS — Z419 Encounter for procedure for purposes other than remedying health state, unspecified: Secondary | ICD-10-CM | POA: Diagnosis not present

## 2022-08-28 DIAGNOSIS — Z419 Encounter for procedure for purposes other than remedying health state, unspecified: Secondary | ICD-10-CM | POA: Diagnosis not present

## 2023-01-23 ENCOUNTER — Encounter: Payer: Self-pay | Admitting: Emergency Medicine

## 2023-01-23 ENCOUNTER — Other Ambulatory Visit: Payer: Self-pay

## 2023-01-23 ENCOUNTER — Emergency Department
Admission: EM | Admit: 2023-01-23 | Discharge: 2023-01-23 | Disposition: A | Discharge status: Home / Self Care | Payer: 59 | Attending: Emergency Medicine | Admitting: Emergency Medicine

## 2023-01-23 DIAGNOSIS — S99929A Unspecified injury of unspecified foot, initial encounter: Secondary | ICD-10-CM

## 2023-01-23 DIAGNOSIS — W010XXA Fall on same level from slipping, tripping and stumbling without subsequent striking against object, initial encounter: Secondary | ICD-10-CM | POA: Diagnosis not present

## 2023-01-23 DIAGNOSIS — S90211A Contusion of right great toe with damage to nail, initial encounter: Secondary | ICD-10-CM | POA: Diagnosis not present

## 2023-01-23 MED ORDER — LIDOCAINE HCL (PF) 1 % IJ SOLN
5.0000 mL | Freq: Once | INTRAMUSCULAR | Status: AC
  Administered 2023-01-23: 5 mL via INTRADERMAL
  Filled 2023-01-23: qty 5

## 2023-01-23 MED ORDER — HYDROCODONE-ACETAMINOPHEN 5-325 MG PO TABS: 1.0000 | ORAL_TABLET | Freq: Four times a day (QID) | ORAL | 0 refills | Status: AC | PRN

## 2023-01-23 MED ORDER — HYDROCODONE-ACETAMINOPHEN 5-325 MG PO TABS
1.0000 | ORAL_TABLET | Freq: Once | ORAL | Status: AC
  Administered 2023-01-23: 1 via ORAL
  Filled 2023-01-23: qty 1

## 2023-01-23 NOTE — ED Provider Notes (Signed)
[facility] Provider Note    Event Date/Time   First MD Initiated Contact with Patient 01/23/23 1207     (approximate)   History   Toe Injury   HPI  Nichole Carlson is a 27 y.o. female  with no significant past medical history and as listed in EMR presents to the emergency department for treatment and evaluation of toenail injury. She tripped and fell due to the wet grass and her right great toenail flipped up and out of the base of the nailbed.      Physical Exam   Triage Vital Signs: ED Triage Vitals  Encounter Vitals Group     BP 01/23/23 1203 103/72     Systolic BP Percentile --      Diastolic BP Percentile --      Pulse Rate 01/23/23 1203 73     Resp 01/23/23 1203 18     Temp 01/23/23 1203 98 F (36.7 C)     Temp Source 01/23/23 1203 Oral     SpO2 01/23/23 1203 100 %     Weight 01/23/23 1159 (!) 325 lb (147.4 kg)     Height 01/23/23 1159 5\' 9"  (1.753 m)     Head Circumference --      Peak Flow --      Pain Score 01/23/23 1159 5     Pain Loc --      Pain Education --      Exclude from Growth Chart --     Most recent vital signs: Vitals:   01/23/23 1203  BP: 103/72  Pulse: 73  Resp: 18  Temp: 98 F (36.7 C)  SpO2: 100%    General: Awake, no distress.  CV:  Good peripheral perfusion.  Resp:  Normal effort.  Abd:  No distention.  Other:  Right great toenail elevated from nail bed and medial aspect at skin fold   ED Results / Procedures / Treatments   Labs (all labs ordered are listed, but only abnormal results are displayed) Labs Reviewed - No data to display   EKG  Not indicated.   RADIOLOGY  Image and radiology report reviewed and interpreted by me. Radiology report consistent with the same.  Not indicated.  PROCEDURES:  Critical Care performed: No  Procedures  Digital block performed with  5ml of 1% lidocaine, foot soaked in saline and betadine solution, edge of the base of the nail was reinserted under  the skin fold and Dermabond used to secure in place.   MEDICATIONS ORDERED IN ED:  Medications  lidocaine (PF) (XYLOCAINE) 1 % injection 5 mL (5 mLs Intradermal Given by Other 01/23/23 1254)  lidocaine (PF) (XYLOCAINE) 1 % injection 5 mL (5 mLs Intradermal Given 01/23/23 1351)  HYDROcodone-acetaminophen (NORCO/VICODIN) 5-325 MG per tablet 1 tablet (1 tablet Oral Given 01/23/23 1350)     IMPRESSION / MDM / ASSESSMENT AND PLAN / ED COURSE   I have reviewed the triage note.  Differential diagnosis includes, but is not limited to, toenail avulsion, toenail injury  Patient's presentation is most consistent with acute, uncomplicated illness.  27 year old female presents to the emergency department for treatment and evaluation after injuring her right great toe. See HPI.  Nail replaced in nailbed as described above. Home care discussed.      FINAL CLINICAL IMPRESSION(S) / ED DIAGNOSES   Final diagnoses:  Injury of great toenail     Rx / DC Orders   ED Discharge Orders  Ordered    HYDROcodone-acetaminophen (NORCO/VICODIN) 5-325 MG tablet  Every 6 hours PRN        01/23/23 1345             Note:  This document was prepared using Dragon voice recognition software and may include unintentional dictation errors.   Chinita Pester, FNP 01/23/23 1407    Corena Herter, MD 01/26/23 1328

## 2023-01-23 NOTE — ED Triage Notes (Signed)
Patient reports mechanical fall as she was leaving her home this morning due to it being wet.  Complains of injury to right great toe, toenail is completely detached from nail bed.  Bleeding controlled at this time, NAD noted.

## 2023-01-28 ENCOUNTER — Telehealth: Payer: Self-pay

## 2023-01-28 NOTE — Transitions of Care (Post Inpatient/ED Visit) (Signed)
Unable to reach pt by phone and left v/m requesting pt to cb [phone].     01/28/2023  Name: Nichole Carlson MRN: [id number] DOB: 1996-03-27  Today's TOC FU Call Status: Today's TOC FU Call Status:: Unsuccessful Call (1st Attempt) Unsuccessful Call (1st Attempt) Date: 01/28/23  Attempted to reach the patient regarding the most recent Inpatient/ED visit.  Follow Up Plan: Additional outreach attempts will be made to reach the patient to complete the Transitions of Care (Post Inpatient/ED visit) call.   Signature Lewanda Rife, LPN

## 2023-03-27 ENCOUNTER — Emergency Department
Admission: EM | Admit: 2023-03-27 | Discharge: 2023-03-27 | Disposition: A | Discharge status: Home / Self Care | Payer: 59 | Attending: Emergency Medicine | Admitting: Emergency Medicine

## 2023-03-27 ENCOUNTER — Encounter: Payer: Self-pay | Admitting: Emergency Medicine

## 2023-03-27 ENCOUNTER — Other Ambulatory Visit: Payer: Self-pay

## 2023-03-27 DIAGNOSIS — N939 Abnormal uterine and vaginal bleeding, unspecified: Secondary | ICD-10-CM

## 2023-03-27 LAB — CBC WITH DIFFERENTIAL/PLATELET
Abs Immature Granulocytes: 0.05 10*3/uL (ref 0.00–0.07)
Basophils Absolute: 0 10*3/uL (ref 0.0–0.1)
Basophils Relative: 0 %
Eosinophils Absolute: 0.2 10*3/uL (ref 0.0–0.5)
Eosinophils Relative: 3 %
HCT: 32.3 % — ABNORMAL LOW (ref 36.0–46.0)
Hemoglobin: 10.1 g/dL — ABNORMAL LOW (ref 12.0–15.0)
Immature Granulocytes: 1 %
Lymphocytes Relative: 22 %
Lymphs Abs: 1.9 10*3/uL (ref 0.7–4.0)
MCH: 23.9 pg — ABNORMAL LOW (ref 26.0–34.0)
MCHC: 31.3 g/dL (ref 30.0–36.0)
MCV: 76.5 fL — ABNORMAL LOW (ref 80.0–100.0)
Monocytes Absolute: 1.2 10*3/uL — ABNORMAL HIGH (ref 0.1–1.0)
Monocytes Relative: 14 %
Neutro Abs: 5.1 10*3/uL (ref 1.7–7.7)
Neutrophils Relative %: 60 %
Platelets: 177 10*3/uL (ref 150–400)
RBC: 4.22 MIL/uL (ref 3.87–5.11)
RDW: 17.3 % — ABNORMAL HIGH (ref 11.5–15.5)
WBC: 8.5 10*3/uL (ref 4.0–10.5)
nRBC: 0 % (ref 0.0–0.2)

## 2023-03-27 LAB — COMPREHENSIVE METABOLIC PANEL
ALT: 22 U/L (ref 0–44)
AST: 20 U/L (ref 15–41)
Albumin: 3.7 g/dL (ref 3.5–5.0)
Alkaline Phosphatase: 91 U/L (ref 38–126)
Anion gap: 7 (ref 5–15)
BUN: 11 mg/dL (ref 6–20)
CO2: 23 mmol/L (ref 22–32)
Calcium: 8.8 mg/dL — ABNORMAL LOW (ref 8.9–10.3)
Chloride: 107 mmol/L (ref 98–111)
Creatinine, Ser: 0.48 mg/dL (ref 0.44–1.00)
GFR, Estimated: >60 mL/min (ref >60)
Glucose, Bld: 104 mg/dL — ABNORMAL HIGH (ref 70–99)
Potassium: 3.6 mmol/L (ref 3.5–5.1)
Sodium: 137 mmol/L (ref 135–145)
Total Bilirubin: 0.3 mg/dL (ref 0.3–1.2)
Total Protein: 7.1 g/dL (ref 6.5–8.1)

## 2023-03-27 LAB — URINALYSIS, ROUTINE W REFLEX MICROSCOPIC
Bacteria, UA: NONE SEEN
Bilirubin Urine: NEGATIVE
Glucose, UA: NEGATIVE mg/dL
Ketones, ur: 5 mg/dL — AB
Leukocytes,Ua: NEGATIVE
Nitrite: NEGATIVE
Protein, ur: 30 mg/dL — AB
RBC / HPF: >50 RBC/hpf (ref 0–5)
Specific Gravity, Urine: 1.031 — ABNORMAL HIGH (ref 1.005–1.030)
WBC, UA: >50 WBC/hpf (ref 0–5)
pH: 5 (ref 5.0–8.0)

## 2023-03-27 LAB — POC URINE PREG, ED: Preg Test, Ur: NEGATIVE

## 2023-03-27 MED ORDER — NORGESTIMATE-ETH ESTRADIOL 0.25-35 MG-MCG PO TABS
1.0000 | ORAL_TABLET | Freq: Every day | ORAL | 2 refills | Status: AC
Start: 2023-03-27 — End: ?

## 2023-03-27 MED ORDER — TRANEXAMIC ACID 650 MG PO TABS: 1300.0000 mg | ORAL_TABLET | Freq: Three times a day (TID) | ORAL | 0 refills | Status: AC

## 2023-03-27 NOTE — ED Triage Notes (Signed)
Pt presents ambulatory to triage via POV with complaints of vaginal bleeding x 1 week. Pt endorses some heavy bleeding with clotting. She endorses some mild intermittent cramping rating it 8/10. Pt has tried OTC medications without any improvement in sx. A&Ox4 at this time. Denies CP or SOB.

## 2023-03-27 NOTE — Discharge Instructions (Signed)
As we discussed, although you are having heavy vaginal bleeding, it is not dangerous at this time.  The plan at this time is for you to start taking the birth control pills prescribed as written and to follow-up with the GYN doctor indicated in this document.  Please return to the emergency department if you develop any new or worsening symptoms that concern you.  Remember that tobacco use greatly increases your risk of developing blood clots while taking birth control pills and avoid smoking or using any other tobacco products.   

## 2023-03-27 NOTE — ED Notes (Signed)
ED Provider at bedside. 

## 2023-03-27 NOTE — ED Provider Notes (Signed)
[facility] Provider Note    Event Date/Time   First MD Initiated Contact with Patient 03/27/23 [phone]     (approximate)   History   Vaginal Bleeding   HPI Nichole Carlson is a 27 y.o. female whose history includes obesity and prior high risk pregnancy.  She presents for heavy vaginal bleeding for about a week.  She says that normally her menstrual cycle is quite predictable and usually last about 4 to 5 days.  When it started up about a week ago she thought it was going to be normal but is gotten more heavy and has lasted for longer.  She sometimes passes blood clots which is unusual for her.  She says that she has intermittently severe abdominal cramping.  No nausea or vomiting.  No fever, chest pain, shortness of breath.  She is not taking any oral contraceptive pills or any contraceptive device of any kind.     Physical Exam   Triage Vital Signs: ED Triage Vitals  Encounter Vitals Group     BP 03/27/23 0504 (!) 121/57     Systolic BP Percentile --      Diastolic BP Percentile --      Pulse Rate 03/27/23 0504 88     Resp 03/27/23 0504 16     Temp 03/27/23 0504 98.3 F (36.8 C)     Temp src --      SpO2 03/27/23 0504 100 %     Weight 03/27/23 0500 (!) 147.4 kg (325 lb)     Height 03/27/23 0500 1.753 m (5\' 9" )     Head Circumference --      Peak Flow --      Pain Score 03/27/23 0500 8     Pain Loc --      Pain Education --      Exclude from Growth Chart --     Most recent vital signs: Vitals:   03/27/23 0504 03/27/23 0700  BP: (!) 121/57 131/69  Pulse: 88 80  Resp: 16 16  Temp: 98.3 F (36.8 C) 98.2 F (36.8 C)  SpO2: 100% 100%    General: Sleeping (evaluation is during the night), no obvious distress when awake. CV:  Good peripheral perfusion.  Regular rate and rhythm Resp:  Normal effort. Speaking easily and comfortably, no accessory muscle usage nor intercostal retractions.   Abd:  No distention.  Morbid obesity.  No tenderness to  palpation throughout the abdomen.   ED Results / Procedures / Treatments   Labs (all labs ordered are listed, but only abnormal results are displayed) Labs Reviewed  CBC WITH DIFFERENTIAL/PLATELET - Abnormal; Notable for the following components:      Result Value   Hemoglobin 10.1 (*)    HCT 32.3 (*)    MCV 76.5 (*)    MCH 23.9 (*)    RDW 17.3 (*)    Monocytes Absolute 1.2 (*)    All other components within normal limits  COMPREHENSIVE METABOLIC PANEL - Abnormal; Notable for the following components:   Glucose, Bld 104 (*)    Calcium 8.8 (*)    All other components within normal limits  URINALYSIS, ROUTINE W REFLEX MICROSCOPIC - Abnormal; Notable for the following components:   Color, Urine YELLOW (*)    APPearance CLOUDY (*)    Specific Gravity, Urine 1.031 (*)    Hgb urine dipstick LARGE (*)    Ketones, ur 5 (*)    Protein, ur 30 (*)  All other components within normal limits  POC URINE PREG, ED - Normal     PROCEDURES:  Critical Care performed: No  Procedures    IMPRESSION / MDM / ASSESSMENT AND PLAN / ED COURSE  I reviewed the triage vital signs and the nursing notes.                              Differential diagnosis includes, but is not limited to, menometrorrhagia, pregnancy related issues such as ectopic pregnancy, uterine fibroids, much less likely adnexal cyst/torsion.  Patient's presentation is most consistent with acute presentation with potential threat to life or bodily function.  Labs/studies ordered: CMP, urinalysis, CBC with differential, urine pregnancy test  Interventions/Medications given:  Medications - No data to display  (Note:  hospital course my include additional interventions and/or labs/studies not listed above.)   Patient is very comfortable and in no distress and mostly just wants to sleep.  Vital signs are normal with no concerning abnormalities.  Lab work is all essentially normal with no substantial anemia, negative  pregnancy, some blood in her urine from the vaginal bleeding, and normal metabolic panel.  I talked with the patient about her heavy vaginal bleeding and I had my usual and customary nonpregnant vaginal bleeding discussion with the patient.  She is comfortable with the plan to establish GYN patient care as an outpatient.  She agreed to start OCPs and I also wrote her a 5-day course of oral TXA to help with the heavy bleeding she is experiencing.  She is not had any increased risk of venous thromboembolism and I believe that this would be a useful treatment modality for her.  I gave her follow-up information with OB/GYN and my usual and customary return precautions.  She understands and agrees with the plan.         FINAL CLINICAL IMPRESSION(S) / ED DIAGNOSES   Final diagnoses:  Abnormal uterine bleeding     Rx / DC Orders   ED Discharge Orders          Ordered    norgestimate-ethinyl estradiol (ORTHO-CYCLEN) 0.25-35 MG-MCG tablet  Daily        03/27/23 0653    tranexamic acid (LYSTEDA) 650 MG TABS tablet  3 times daily        03/27/23 0865             Note:  This document was prepared using Dragon voice recognition software and may include unintentional dictation errors.   Loleta Rose, MD 03/27/23 [phone]

## 2023-04-01 ENCOUNTER — Telehealth: Payer: Self-pay

## 2023-04-01 NOTE — Transitions of Care (Post Inpatient/ED Visit) (Signed)
Per chart review tab pt was seen by GYN at [facility] 03/31/23; sending FYI to [place]ery [place] NP.       04/01/2023  Name: Nichole Carlson MRN: [id number] DOB: January 03, 1996  Today's TOC FU Call Status: Today's TOC FU Call Status::  (did not contact pt; pt seen GYN at [facility] 03/31/23. sending FYI to [name] NP.) Patient's Name and Date of Birth confirmed.  Transition Care Management Follow-up Telephone Call    Items Reviewed:    Medications Reviewed Today: Medications Reviewed Today   Medications were not reviewed in this encounter     Home Care and Equipment/Supplies:    Functional Questionnaire:    Follow up appointments reviewed:      SIGNATURE Lewanda Rife, LPN

## 2023-04-02 NOTE — Telephone Encounter (Signed)
Noted  

## 2023-04-29 ENCOUNTER — Ambulatory Visit: Payer: 59

## 2023-05-19 ENCOUNTER — Ambulatory Visit: Payer: 59 | Admitting: Advanced Practice Midwife

## 2023-05-19 ENCOUNTER — Encounter: Payer: Self-pay | Admitting: Advanced Practice Midwife

## 2023-05-19 DIAGNOSIS — F17200 Nicotine dependence, unspecified, uncomplicated: Secondary | ICD-10-CM

## 2023-05-19 DIAGNOSIS — F1290 Cannabis use, unspecified, uncomplicated: Secondary | ICD-10-CM | POA: Insufficient documentation

## 2023-05-19 DIAGNOSIS — Z113 Encounter for screening for infections with a predominantly sexual mode of transmission: Secondary | ICD-10-CM

## 2023-05-19 LAB — HM HIV SCREENING LAB: HM HIV Screening: NEGATIVE

## 2023-05-19 LAB — WET PREP FOR TRICH, YEAST, CLUE
Trichomonas Exam: NEGATIVE
Yeast Exam: NEGATIVE

## 2023-05-19 LAB — HM HEPATITIS C SCREENING LAB: HM Hepatitis Screen: NEGATIVE

## 2023-05-19 LAB — HEPATITIS B SURFACE ANTIGEN

## 2023-05-19 NOTE — Progress Notes (Signed)
[facility] Department  STI clinic/screening visit [address] [place] [address] [phone]  Subjective:  Nichole Carlson is a 27 y.o. smoker G2P1 female being seen today for an STI screening visit. The patient reports they do not have symptoms.  Patient reports that they do not desire a pregnancy in the next year.   They reported they wanting to think about contraception   No LMP recorded.  Patient has the following medical conditions:   Patient Active Problem List   Diagnosis Date Noted   Smoker 05/19/2023   Class 3 severe obesity without serious comorbidity with body mass index (BMI) of 45.0 to 49.9 in adult [facility]) 12/12/2021   GDM (gestational diabetes mellitus), class A1 06/01/2020   BMI 50.0-59.9, adult (HCC) 05/20/2020   Gestational diabetes 04/19/2020   Obesity complicating peripregnancy, antepartum 03/19/2020    Chief Complaint  Patient presents with   SEXUALLY TRANSMITTED DISEASE    Pt is here for screening and has no symptoms    HPI  Patient reports asymptomatic. LMP 04/25/23. Last sex this am without condom; with current partner x 12 years; 3 partners in last 3 mo. Last cig 2022. Last vaped 03/2023. Last cigar 2018. Last MJ last night. Last ETOH 05/15/23 (4 beers+10 shots Tequila) 2x/mo. Last pap 11/28/19 neg  Does the patient using douching products? No  Last HIV test per patient/review of record was  Lab Results  Component Value Date   HMHIVSCREEN Negative - Validated 11/20/2020    Lab Results  Component Value Date   HIV Non Reactive 04/17/2020     Last HEPC test per patient/review of record was No results found for: "HMHEPCSCREEN" No components found for: "HEPC"   Last HEPB test per patient/review of record was No components found for: "HMHEPBSCREEN" No components found for: "HEPC"   Patient reports last pap was  Lab Results  Component Value Date   DIAGPAP  11/28/2019    - Negative for intraepithelial lesion or malignancy  (NILM)   No results found for: "SPECADGYN"  Screening for MPX risk: Does the patient have an unexplained rash? No Is the patient MSM? No Does the patient endorse multiple sex partners or anonymous sex partners? Yes Did the patient have close or sexual contact with a person diagnosed with MPX? No Has the patient traveled outside the [place] where MPX is endemic? No Is there a high clinical suspicion for MPX-- evidenced by one of the following No  -Unlikely to be chickenpox  -Lymphadenopathy  -Rash that present in same phase of evolution on any given body part See flowsheet for further details and programmatic requirements.   Immunization history:  Immunization History  Administered Date(s) Administered   HPV Quadrivalent 01/02/2008, 03/08/2008   Hepatitis B, PED/ADOLESCENT 1994-02-25, 10/16/1993, 04/20/1994   MMR 11/03/1994, 01/16/1999   Meningococcal Conjugate 01/02/2008   Tdap 03/19/2020     The following portions of the patient's history were reviewed and updated as appropriate: allergies, current medications, past medical history, past social history, past surgical history and problem list.  Objective:  There were no vitals filed for this visit.  Physical Exam Vitals and nursing note reviewed.  Constitutional:      Appearance: Normal appearance. She is obese.  HENT:     Head: Normocephalic and atraumatic.     Mouth/Throat:     Mouth: Mucous membranes are moist.     Pharynx: Oropharynx is clear. No oropharyngeal exudate or posterior oropharyngeal erythema.  Eyes:  Conjunctiva/sclera: Conjunctivae normal.  Pulmonary:     Effort: Pulmonary effort is normal.  Abdominal:     Palpations: Abdomen is soft. There is no mass.     Tenderness: There is no abdominal tenderness. There is no rebound.     Comments: Soft without masses or tenderness, poor tone, increased adipose  Genitourinary:    General: Normal vulva.     Exam position: Lithotomy position.     Pubic Area: No rash  or pubic lice.      Labia:        Right: No rash or lesion.        Left: No rash or lesion.      Vagina: Vaginal discharge (small amt white creamy leukorrhea, ph<4.5) present. No erythema, bleeding or lesions.     Cervix: Normal. No cervical motion tenderness, discharge, friability, lesion or erythema.     Rectum: Normal.     Comments: pH = <4.5 Lymphadenopathy:     Head:     Right side of head: No preauricular or posterior auricular adenopathy.     Left side of head: No preauricular or posterior auricular adenopathy.     Cervical: No cervical adenopathy.     Right cervical: No superficial, deep or posterior cervical adenopathy.    Left cervical: No superficial, deep or posterior cervical adenopathy.     Upper Body:     Right upper body: No supraclavicular, axillary or epitrochlear adenopathy.     Left upper body: No supraclavicular, axillary or epitrochlear adenopathy.     Lower Body: No right inguinal adenopathy. No left inguinal adenopathy.  Skin:    General: Skin is warm and dry.     Findings: No rash.  Neurological:     Mental Status: She is alert and oriented to person, place, and time.     Assessment and Plan:  Nichole Carlson is a 27 y.o. female presenting to the [facility] Department for STI screening  1. Screening examination for venereal disease Treat wet mount per standing orders Immunization nurse consult  - WET PREP FOR TRICH, YEAST, CLUE - Gonococcus culture - Chlamydia/Gonorrhea [place] [facility] - HIV/HCV [place] [facility] - Syphilis Serology, [place] [facility] - HBV Antigen/Antibody [facility] Lab - Gonococcus culture  2. Smoker Counseled via 5 A's to stop smoking   Patient accepted all screenings including oral, vaginal CT/GC and bloodwork for HIV/RPR, and wet prep. Patient meets criteria for HepB screening? Yes. Ordered? yes Patient meets criteria for HepC screening? Yes. Ordered? yes  Treat wet prep per standing order Discussed time line for State Lab  results and that patient will be called with positive results and encouraged patient to call if she had not heard in 2 weeks.  Counseled to return or seek care for continued or worsening symptoms Recommended repeat testing in 3 months with positive results. Recommended condom use with all sex  Patient is currently using  nothing  to prevent pregnancy.    Return in about 2 weeks (around 06/02/2023) for yearly physical exam.  No future appointments.  Alberteen Spindle, CNM

## 2023-05-19 NOTE — Progress Notes (Signed)
Pt is here for STD screening , Wet prep results reviewed with pt, no treatment required per provider. Latex free condoms given and opportunity given patient to ask questions for any clarifications. Sonda Primes, RN.

## 2023-05-21 ENCOUNTER — Encounter: Payer: Self-pay | Admitting: Family Medicine

## 2023-05-26 LAB — GONOCOCCUS CULTURE

## 2023-06-07 ENCOUNTER — Telehealth: Payer: Self-pay

## 2023-06-07 NOTE — Telephone Encounter (Signed)
PW verified.  Pt notified of positive Chlamydia results.  Treatment appointment made for 06/08/2023 @ 8:30 am.  Berdie Ogren, RN

## 2023-06-08 ENCOUNTER — Ambulatory Visit: Payer: 59

## 2023-06-08 DIAGNOSIS — A749 Chlamydial infection, unspecified: Secondary | ICD-10-CM

## 2023-06-08 MED ORDER — AZITHROMYCIN 500 MG PO TABS
1000.0000 mg | ORAL_TABLET | Freq: Once | ORAL | Status: AC
Start: 2023-06-08 — End: 2023-06-08
  Administered 2023-06-08: 1000 mg via ORAL

## 2023-06-08 NOTE — Progress Notes (Signed)
Pt is here for treatment of Chlamydia. LMP: 05/22/2023 Pt states that she has had unprotected sex 7-8 times in the last 2 weeks. The patient was dispensed Azithromycin 1000mg   today. I provided counseling today regarding the medication. We discussed the medication, the side effects and when to call clinic. Patient given the opportunity to ask questions. Questions answered.  Condoms given to pt.  Berdie Ogren, RN

## 2023-07-25 ENCOUNTER — Encounter (HOSPITAL_COMMUNITY): Payer: Self-pay

## 2023-07-25 ENCOUNTER — Ambulatory Visit (HOSPITAL_COMMUNITY)
Admission: EM | Admit: 2023-07-25 | Discharge: 2023-07-25 | Disposition: A | Discharge status: Home / Self Care | Payer: 59 | Attending: Emergency Medicine | Admitting: Emergency Medicine

## 2023-07-25 DIAGNOSIS — K529 Noninfective gastroenteritis and colitis, unspecified: Secondary | ICD-10-CM | POA: Diagnosis not present

## 2023-07-25 LAB — POCT URINALYSIS DIP (MANUAL ENTRY)
Bilirubin, UA: NEGATIVE
Blood, UA: NEGATIVE
Glucose, UA: NEGATIVE mg/dL
Ketones, POC UA: NEGATIVE mg/dL
Leukocytes, UA: NEGATIVE
Nitrite, UA: NEGATIVE
Protein Ur, POC: NEGATIVE mg/dL
Spec Grav, UA: 1.025 (ref 1.010–1.025)
Urobilinogen, UA: 0.2 U/dL
pH, UA: 6.5 (ref 5.0–8.0)

## 2023-07-25 LAB — POCT URINE PREGNANCY: Preg Test, Ur: NEGATIVE

## 2023-07-25 MED ORDER — ONDANSETRON 4 MG PO TBDP
4.0000 mg | ORAL_TABLET | Freq: Once | ORAL | Status: AC
  Administered 2023-07-25: 4 mg via ORAL

## 2023-07-25 MED ORDER — ONDANSETRON 4 MG PO TBDP: 4.0000 mg | ORAL_TABLET | Freq: Four times a day (QID) | ORAL | 0 refills | Status: AC | PRN

## 2023-07-25 MED ORDER — ONDANSETRON 4 MG PO TBDP
ORAL_TABLET | ORAL | Status: AC
  Filled 2023-07-25: qty 1

## 2023-07-25 NOTE — Discharge Instructions (Signed)
The zofran can be used every 6 hours as needed to settle the stomach  Hydrate as much as possible  Bland diet if tolerated

## 2023-07-25 NOTE — ED Provider Notes (Signed)
[facility]    CSN: [id number] Arrival date & time: 07/25/23  1014     History   Chief Complaint Chief Complaint  Patient presents with   Abdominal Pain   Emesis   Diarrhea    HPI Nichole Carlson is a 28 y.o. female.  Ate [employer] last night, about 1 hour later developed abdominal discomfort with increased BS. Early this morning 1 episode of emesis. Some loose stools. No fever but had chills No medications taken yet. Tolerates fluids Reports flu exposures as well  LMP started 2 days ago Denies urinary symptoms or flank pain  Recent chlamydia, treated with azith  Past Medical History:  Diagnosis Date   COVID-19    Family history of adverse reaction to anesthesia    mom had problems waking up   Gestational diabetes 04/19/2020   UTI (lower urinary tract infection)    Vertigo    Yeast infection     Patient Active Problem List   Diagnosis Date Noted   Smoker 05/19/2023   Marijuana use 05/19/2023   Class 3 severe obesity without serious comorbidity with body mass index (BMI) of 45.0 to 49.9 in adult (HCC) 12/12/2021   GDM (gestational diabetes mellitus), class A1 06/01/2020   BMI 50.0-59.9, adult (HCC) 05/20/2020   Gestational diabetes 04/19/2020   Obesity complicating peripregnancy, antepartum 03/19/2020    Past Surgical History:  Procedure Laterality Date   TONSILLECTOMY     TONSILLECTOMY     wisdonm teeth removed      OB History     Gravida  1   Para  1   Term  1   Preterm      AB      Living  1      SAB      IAB      Ectopic      Multiple  0   Live Births  1            Home Medications    Prior to Admission medications   Medication Sig Start Date End Date Taking? Authorizing Provider  ondansetron (ZOFRAN-ODT) 4 MG disintegrating tablet Take 1 tablet (4 mg total) by mouth every 6 (six) hours as needed for nausea or vomiting. 07/25/23  Yes Tiah Heckel, Lurena Joiner, PA-C  norgestimate-ethinyl estradiol (ORTHO-CYCLEN) 0.25-35  MG-MCG tablet Take 1 tablet by mouth daily. Patient not taking: Reported on 05/19/2023 03/27/23   Loleta Rose, MD    Family History Family History  Problem Relation Age of Onset   Congestive Heart Failure Mother    COPD Mother    Diabetes Mother    Hypertension Mother    Gout Mother    Hypertension Father    Diabetes Father    Cancer Maternal Grandmother        skin   Breast cancer Maternal Grandmother 64    Social History Social History   Tobacco Use   Smoking status: Former    Current packs/day: 0.50    Types: Cigarettes, Cigars, E-cigarettes   Smokeless tobacco: Never  Vaping Use   Vaping status: Every Day   Substances: Nicotine, Flavoring  Substance Use Topics   Alcohol use: Not Currently    Alcohol/week: 14.0 standard drinks of alcohol    Types: 4 Cans of beer, 10 Shots of liquor per week    Comment: last use 05/15/23 1x/mo   Drug use: Yes    Types: Marijuana     Allergies   Fish allergy and Latex  Review of Systems Review of Systems As per HPI  Physical Exam Triage Vital Signs ED Triage Vitals  Encounter Vitals Group     BP 07/25/23 1145 121/75     Systolic BP Percentile --      Diastolic BP Percentile --      Pulse Rate 07/25/23 1145 (!) 58     Resp 07/25/23 1145 16     Temp 07/25/23 1145 97.7 F (36.5 C)     Temp Source 07/25/23 1145 Oral     SpO2 07/25/23 1145 95 %     Weight --      Height --      Head Circumference --      Peak Flow --      Pain Score 07/25/23 1144 6     Pain Loc --      Pain Education --      Exclude from Growth Chart --    No data found.  Updated Vital Signs BP 121/75 (BP Location: Left Arm)   Pulse (!) 58   Temp 97.7 F (36.5 C) (Oral)   Resp 16   LMP 07/22/2023   SpO2 95%   Physical Exam Vitals and nursing note reviewed.  Constitutional:      General: She is not in acute distress.    Appearance: She is obese.  HENT:     Mouth/Throat:     Mouth: Mucous membranes are moist.     Pharynx: Oropharynx  is clear.  Eyes:     Conjunctiva/sclera: Conjunctivae normal.  Cardiovascular:     Rate and Rhythm: Normal rate and regular rhythm.     Heart sounds: Normal heart sounds.  Pulmonary:     Effort: Pulmonary effort is normal. No respiratory distress.     Breath sounds: Normal breath sounds.  Abdominal:     General: Bowel sounds are normal.     Palpations: Abdomen is soft.     Tenderness: There is no abdominal tenderness. There is no right CVA tenderness, left CVA tenderness, guarding or rebound.  Musculoskeletal:        General: Normal range of motion.  Skin:    General: Skin is warm and dry.  Neurological:     Mental Status: She is alert and oriented to person, place, and time.     UC Treatments / Results  Labs (all labs ordered are listed, but only abnormal results are displayed) Labs Reviewed  POCT URINALYSIS DIP (MANUAL ENTRY)  POCT URINE PREGNANCY    EKG  Radiology No results found.  Procedures Procedures (including critical care time)  Medications Ordered in UC Medications  ondansetron (ZOFRAN-ODT) disintegrating tablet 4 mg (4 mg Oral Given 07/25/23 1215)    Initial Impression / Assessment and Plan / UC Course  I have reviewed the triage vital signs and the nursing notes.  Pertinent labs & imaging results that were available during my care of the patient were reviewed by me and considered in my medical decision making (see chart for details).  UPT negative. UA is unremarkable Zofran ODT given Suspect gastroenteritis vs food bourne illness PO challenge successful Zofran sent to pharmacy, advised fluids and bland diet  Work note is provided  Final Clinical Impressions(s) / UC Diagnoses   Final diagnoses:  Gastroenteritis     Discharge Instructions      The zofran can be used every 6 hours as needed to settle the stomach  Hydrate as much as possible  Bland diet if tolerated  ED Prescriptions     Medication Sig Dispense Auth. Provider    ondansetron (ZOFRAN-ODT) 4 MG disintegrating tablet Take 1 tablet (4 mg total) by mouth every 6 (six) hours as needed for nausea or vomiting. 20 tablet Jaimy Kliethermes, Lurena Joiner, PA-C      PDMP not reviewed this encounter.   Leiland Mihelich, Lurena Joiner, [place] 07/25/23 1259

## 2023-07-25 NOTE — ED Triage Notes (Signed)
Patient c/o generalized abdominal pain, N/V x1 and diarrhea x 6. Patient states her "stomach is gurgling and squeezing.  Patient has not had any medications for her symptoms.

## 2023-08-27 ENCOUNTER — Encounter: Payer: Self-pay | Admitting: Family Medicine

## 2023-08-27 ENCOUNTER — Ambulatory Visit: Payer: 59 | Admitting: Family Medicine

## 2023-08-27 DIAGNOSIS — Z113 Encounter for screening for infections with a predominantly sexual mode of transmission: Secondary | ICD-10-CM

## 2023-08-27 LAB — WET PREP FOR TRICH, YEAST, CLUE
Trichomonas Exam: NEGATIVE
Yeast Exam: NEGATIVE

## 2023-08-27 LAB — HM HIV SCREENING LAB: HM HIV Screening: NEGATIVE

## 2023-08-27 NOTE — Progress Notes (Signed)
 [facility] Department STI clinic [address], [address] [place] [address] Main phone: [phone]  STI screening visit  Subjective:  Nichole Carlson is a 28 y.o. female being seen today for an STI screening visit. The patient reports they do not have symptoms.  Patient reports that they do not desire a pregnancy in the next year.   They reported they are not interested in discussing contraception today.    Patient's last menstrual period was 08/12/2023 (exact date).  Patient has the following medical conditions:  Patient Active Problem List   Diagnosis Date Noted   Smoker 05/19/2023   Marijuana use 05/19/2023   Class 3 severe obesity without serious comorbidity with body mass index (BMI) of 45.0 to 49.9 in adult [facility]) 12/12/2021   GDM (gestational diabetes mellitus), class A1 06/01/2020   BMI 50.0-59.9, adult (HCC) 05/20/2020   Gestational diabetes 04/19/2020   Obesity complicating peripregnancy, antepartum 03/19/2020    Chief Complaint  Patient presents with   SEXUALLY TRANSMITTED DISEASE    Screening    HPI HPI Patient reports to clinic for STI testing- asymptomatic  Does the patient using douching products? No  Last HIV test per patient/review of record was  Lab Results  Component Value Date   HMHIVSCREEN Negative - Validated 05/19/2023    Lab Results  Component Value Date   HIV Non Reactive 04/17/2020     Last HEPC test per patient/review of record was  Lab Results  Component Value Date   HMHEPCSCREEN Negative-Validated 05/19/2023   No components found for: "HEPC"   Last HEPB test per patient/review of record was No components found for: "HMHEPBSCREEN"   Patient reports last pap was: 2021     Component Value Date/Time   DIAGPAP  11/28/2019 1524    - Negative for intraepithelial lesion or malignancy (NILM)   ADEQPAP  11/28/2019 1524    Satisfactory for evaluation; transformation zone component PRESENT.   No results found  for: "SPECADGYN" Result Date Procedure Results Follow-ups  11/28/2019 Cytology - PAP Adequacy: Satisfactory for evaluation; transformation zone component PRESENT. Diagnosis: - Negative for intraepithelial lesion or malignancy (NILM)     Screening for MPX risk: Does the patient have an unexplained rash? No Is the patient MSM? No Does the patient endorse multiple sex partners or anonymous sex partners? No Did the patient have close or sexual contact with a person diagnosed with MPX? No Has the patient traveled outside the [place] where MPX is endemic? No Is there a high clinical suspicion for MPX-- evidenced by one of the following No  -Unlikely to be chickenpox  -Lymphadenopathy  -Rash that present in same phase of evolution on any given body part See flowsheet for further details and programmatic requirements.   Immunization history:  Immunization History  Administered Date(s) Administered   HPV Quadrivalent 01/02/2008, 03/08/2008   Hepatitis B, PED/ADOLESCENT 1994-03-24, 10/16/1993, 04/20/1994   MMR 11/03/1994, 01/16/1999   Meningococcal Conjugate 01/02/2008   Tdap 03/19/2020     The following portions of the patient's history were reviewed and updated as appropriate: allergies, current medications, past medical history, past social history, past surgical history and problem list.  Objective:  There were no vitals filed for this visit.  Physical Exam Vitals and nursing note reviewed.  Constitutional:      Appearance: She is obese.  HENT:     Head: Normocephalic and atraumatic.     Mouth/Throat:     Mouth: Mucous membranes are moist.  Pharynx: Oropharynx is clear. No oropharyngeal exudate or posterior oropharyngeal erythema.  Pulmonary:     Effort: Pulmonary effort is normal.  Abdominal:     General: Abdomen is flat.     Palpations: There is no mass.     Tenderness: There is no abdominal tenderness. There is no rebound.  Genitourinary:    General: Normal vulva.     Exam  position: Lithotomy position.     Pubic Area: No rash or pubic lice.      Labia:        Right: No rash or lesion.        Left: No rash or lesion.      Vagina: Vaginal discharge present. No erythema, bleeding or lesions.     Cervix: No cervical motion tenderness, discharge, friability, lesion or erythema.     Uterus: Normal.      Adnexa: Right adnexa normal and left adnexa normal.     Rectum: Normal.     Comments: pH = 4  Small amt of white discharge present Lymphadenopathy:     Head:     Right side of head: No preauricular or posterior auricular adenopathy.     Left side of head: No preauricular or posterior auricular adenopathy.     Cervical: No cervical adenopathy.     Upper Body:     Right upper body: No supraclavicular, axillary or epitrochlear adenopathy.     Left upper body: No supraclavicular, axillary or epitrochlear adenopathy.     Lower Body: No right inguinal adenopathy. No left inguinal adenopathy.  Skin:    General: Skin is warm and dry.     Findings: No rash.  Neurological:     Mental Status: She is alert and oriented to person, place, and time.     Assessment and Plan:  Nichole Carlson is a 28 y.o. female presenting to the [facility] Department for STI screening  1. Screening for venereal disease (Primary)  - Chlamydia/Gonorrhea [place] [facility] - HIV [place] [facility] - Syphilis Serology, [place] [facility] - WET PREP FOR TRICH, YEAST, CLUE   Patient accepted all screenings including vaginal CT/GC and bloodwork for HIV/RPR, and wet prep. Patient meets criteria for HepB screening? No. Ordered? not applicable Patient meets criteria for HepC screening? No. Ordered? not applicable  Treat wet prep per standing order Discussed time line for State Lab results and that patient will be called with positive results and encouraged patient to call if she had not heard in 2 weeks.  Counseled to return or seek care for continued or worsening symptoms Recommended repeat  testing in 3 months with positive results. Recommended condom use with all sex for STI prevention.   Patient is currently using  nothing  to prevent pregnancy.    Return if symptoms worsen or fail to improve, for STI screening.  No future appointments.  Lenice Llamas, [place]

## 2023-08-27 NOTE — Progress Notes (Addendum)
 Pt is here for STD screening.  Wet mount results reviewed, no treatment needed per SO.   Non Latex Condoms given.  Berdie Ogren, RN

## 2023-09-01 LAB — GONOCOCCUS CULTURE
